# Patient Record
Sex: Male | Born: 1975 | Race: White | Hispanic: No | Marital: Single | Smoking: Current every day smoker
Health system: Southern US, Community
[De-identification: ages and names within clinical notes are randomized; demographics above are authoritative.]

## PROBLEM LIST (undated history)

## (undated) DIAGNOSIS — G2581 Restless legs syndrome: Secondary | ICD-10-CM

## (undated) DIAGNOSIS — M1990 Unspecified osteoarthritis, unspecified site: Secondary | ICD-10-CM

## (undated) DIAGNOSIS — M5430 Sciatica, unspecified side: Secondary | ICD-10-CM

## (undated) HISTORY — PX: TESTICLE SURGERY: SHX794

---

## 1998-08-14 ENCOUNTER — Emergency Department (HOSPITAL_COMMUNITY): Admission: EM | Admit: 1998-08-14 | Discharge: 1998-08-14 | Payer: Self-pay | Admitting: Emergency Medicine

## 2001-11-02 ENCOUNTER — Emergency Department (HOSPITAL_COMMUNITY): Admission: EM | Admit: 2001-11-02 | Discharge: 2001-11-02 | Payer: Self-pay | Admitting: Internal Medicine

## 2002-04-21 ENCOUNTER — Emergency Department (HOSPITAL_COMMUNITY): Admission: EM | Admit: 2002-04-21 | Discharge: 2002-04-21 | Payer: Self-pay | Admitting: Emergency Medicine

## 2003-08-05 ENCOUNTER — Emergency Department (HOSPITAL_COMMUNITY): Admission: EM | Admit: 2003-08-05 | Discharge: 2003-08-06 | Payer: Self-pay | Admitting: *Deleted

## 2004-07-12 ENCOUNTER — Emergency Department (HOSPITAL_COMMUNITY): Admission: EM | Admit: 2004-07-12 | Discharge: 2004-07-12 | Payer: Self-pay | Admitting: Family Medicine

## 2004-12-27 ENCOUNTER — Emergency Department (HOSPITAL_COMMUNITY): Admission: EM | Admit: 2004-12-27 | Discharge: 2004-12-27 | Payer: Self-pay | Admitting: Family Medicine

## 2005-01-03 ENCOUNTER — Emergency Department (HOSPITAL_COMMUNITY): Admission: EM | Admit: 2005-01-03 | Discharge: 2005-01-03 | Payer: Self-pay | Admitting: Emergency Medicine

## 2005-01-09 ENCOUNTER — Emergency Department (HOSPITAL_COMMUNITY): Admission: EM | Admit: 2005-01-09 | Discharge: 2005-01-09 | Payer: Self-pay | Admitting: Emergency Medicine

## 2005-01-15 ENCOUNTER — Emergency Department (HOSPITAL_COMMUNITY): Admission: EM | Admit: 2005-01-15 | Discharge: 2005-01-15 | Payer: Self-pay | Admitting: Emergency Medicine

## 2005-04-20 ENCOUNTER — Emergency Department (HOSPITAL_COMMUNITY): Admission: EM | Admit: 2005-04-20 | Discharge: 2005-04-20 | Payer: Self-pay | Admitting: Family Medicine

## 2005-10-21 ENCOUNTER — Ambulatory Visit: Payer: Self-pay | Admitting: Internal Medicine

## 2005-10-21 ENCOUNTER — Ambulatory Visit: Payer: Self-pay | Admitting: Infectious Diseases

## 2005-10-21 ENCOUNTER — Inpatient Hospital Stay (HOSPITAL_COMMUNITY): Admission: EM | Admit: 2005-10-21 | Discharge: 2005-10-25 | Payer: Self-pay | Admitting: Emergency Medicine

## 2007-12-15 ENCOUNTER — Emergency Department (HOSPITAL_COMMUNITY): Admission: EM | Admit: 2007-12-15 | Discharge: 2007-12-15 | Payer: Self-pay | Admitting: Emergency Medicine

## 2007-12-18 ENCOUNTER — Ambulatory Visit (HOSPITAL_COMMUNITY): Admission: RE | Admit: 2007-12-18 | Discharge: 2007-12-18 | Payer: Self-pay | Admitting: Emergency Medicine

## 2007-12-18 ENCOUNTER — Emergency Department (HOSPITAL_COMMUNITY): Admission: EM | Admit: 2007-12-18 | Discharge: 2007-12-18 | Payer: Self-pay | Admitting: Emergency Medicine

## 2008-04-06 ENCOUNTER — Inpatient Hospital Stay (HOSPITAL_COMMUNITY): Admission: EM | Admit: 2008-04-06 | Discharge: 2008-04-09 | Payer: Self-pay | Admitting: Emergency Medicine

## 2008-04-06 ENCOUNTER — Encounter (INDEPENDENT_AMBULATORY_CARE_PROVIDER_SITE_OTHER): Payer: Self-pay | Admitting: Emergency Medicine

## 2008-11-10 ENCOUNTER — Emergency Department (HOSPITAL_COMMUNITY): Admission: EM | Admit: 2008-11-10 | Discharge: 2008-11-11 | Payer: Self-pay | Admitting: Emergency Medicine

## 2008-11-10 ENCOUNTER — Emergency Department (HOSPITAL_COMMUNITY): Admission: EM | Admit: 2008-11-10 | Discharge: 2008-11-10 | Payer: Self-pay | Admitting: Family Medicine

## 2008-11-29 ENCOUNTER — Emergency Department (HOSPITAL_COMMUNITY): Admission: EM | Admit: 2008-11-29 | Discharge: 2008-11-29 | Payer: Self-pay | Admitting: Family Medicine

## 2008-12-20 ENCOUNTER — Emergency Department (HOSPITAL_COMMUNITY): Admission: EM | Admit: 2008-12-20 | Discharge: 2008-12-20 | Payer: Self-pay | Admitting: Family Medicine

## 2010-05-12 LAB — POCT URINALYSIS DIP (DEVICE)
Nitrite: NEGATIVE
Protein, ur: NEGATIVE mg/dL
Urobilinogen, UA: 1 mg/dL (ref 0.0–1.0)
pH: 7 (ref 5.0–8.0)

## 2010-05-12 LAB — CBC
MCHC: 34.2 g/dL (ref 30.0–36.0)
MCV: 96.2 fL (ref 78.0–100.0)
Platelets: 263 10*3/uL (ref 150–400)

## 2010-05-12 LAB — COMPREHENSIVE METABOLIC PANEL
AST: 13 U/L (ref 0–37)
Albumin: 4 g/dL (ref 3.5–5.2)
BUN: 6 mg/dL (ref 6–23)
Calcium: 9.2 mg/dL (ref 8.4–10.5)
Creatinine, Ser: 0.8 mg/dL (ref 0.4–1.5)
GFR calc Af Amer: >60 mL/min (ref >60)
GFR calc non Af Amer: >60 mL/min (ref >60)

## 2010-05-12 LAB — DIFFERENTIAL
Basophils Relative: 1 % (ref 0–1)
Eosinophils Absolute: 0.2 10*3/uL (ref 0.0–0.7)
Lymphs Abs: 3.1 10*3/uL (ref 0.7–4.0)
Neutro Abs: 7.8 10*3/uL — ABNORMAL HIGH (ref 1.7–7.7)
Neutrophils Relative %: 67 % (ref 43–77)

## 2010-05-12 LAB — URINALYSIS, ROUTINE W REFLEX MICROSCOPIC
Glucose, UA: NEGATIVE mg/dL
Protein, ur: NEGATIVE mg/dL
Specific Gravity, Urine: 1.027 (ref 1.005–1.030)
Urobilinogen, UA: 1 mg/dL (ref 0.0–1.0)

## 2010-05-19 LAB — URINE MICROSCOPIC-ADD ON

## 2010-05-19 LAB — BASIC METABOLIC PANEL
Calcium: 8.7 mg/dL (ref 8.4–10.5)
Chloride: 107 mEq/L (ref 96–112)
Creatinine, Ser: 0.82 mg/dL (ref 0.4–1.5)
GFR calc Af Amer: >60 mL/min (ref >60)
GFR calc Af Amer: >60 mL/min (ref >60)
Potassium: 3.4 mEq/L — ABNORMAL LOW (ref 3.5–5.1)
Sodium: 138 mEq/L (ref 135–145)

## 2010-05-19 LAB — URINALYSIS, ROUTINE W REFLEX MICROSCOPIC
Glucose, UA: NEGATIVE mg/dL
Leukocytes, UA: NEGATIVE
Protein, ur: 30 mg/dL — AB
pH: 8 (ref 5.0–8.0)

## 2010-05-19 LAB — LUPUS ANTICOAGULANT PANEL
DRVVT: 39.5 secs (ref 36.1–47.0)
Lupus Anticoagulant: NOT DETECTED

## 2010-05-19 LAB — CBC
HCT: 38.3 % — ABNORMAL LOW (ref 39.0–52.0)
Hemoglobin: 13.2 g/dL (ref 13.0–17.0)
MCV: 95.1 fL (ref 78.0–100.0)
RBC: 4.03 MIL/uL — ABNORMAL LOW (ref 4.22–5.81)
RBC: 4.5 MIL/uL (ref 4.22–5.81)
WBC: 14.9 10*3/uL — ABNORMAL HIGH (ref 4.0–10.5)
WBC: 8.9 10*3/uL (ref 4.0–10.5)

## 2010-05-19 LAB — DIFFERENTIAL
Basophils Relative: 0 % (ref 0–1)
Lymphocytes Relative: 16 % (ref 12–46)
Lymphs Abs: 2.4 10*3/uL (ref 0.7–4.0)
Monocytes Relative: 6 % (ref 3–12)
Neutro Abs: 11.6 10*3/uL — ABNORMAL HIGH (ref 1.7–7.7)
Neutrophils Relative %: 78 % — ABNORMAL HIGH (ref 43–77)

## 2010-05-19 LAB — SYNOVIAL CELL COUNT + DIFF, W/ CRYSTALS
Neutrophil, Synovial: 98 % — ABNORMAL HIGH (ref 0–25)
WBC, Synovial: 45000 /mm3 — ABNORMAL HIGH (ref 0–200)

## 2010-05-19 LAB — BODY FLUID CULTURE: Culture: NO GROWTH

## 2010-05-19 LAB — ANA: Anti Nuclear Antibody(ANA): NEGATIVE

## 2010-05-19 LAB — HSV PCR: HSV 2 , PCR: NOT DETECTED

## 2010-05-19 LAB — CULTURE, BLOOD (SINGLE)

## 2010-06-21 NOTE — H&P (Signed)
Brandon Bartlett, Brandon Bartlett        ACCOUNT NO.:  [id number]   MEDICAL RECORD NO.:  [id number]          PATIENT TYPE:  INP   LOCATION:                               FACILITY:  [facility]   PHYSICIAN:  Alvy Beal, MD    DATE OF BIRTH:  04/16/1975   DATE OF ADMISSION:  04/06/2008  DATE OF DISCHARGE:                              HISTORY & PHYSICAL   REASON FOR ADMISSION:  Right septic hip.   HISTORY:  This is a very pleasant 35 year old gentleman who first began  complaining of significant right groin pain on Sunday evening, February  29, 2010.  He presented to the emergency room early this afternoon at  approximately 2:00 p.m.  During the course of his workup, a CT scan was  done and there was a question of a fluid collection in the right hip.  While he was being evaluated, he was noted to be febrile to about 101.4  and his white count was elevated.  Because of the fluid collection and  elevated white count, there was a question of a septic hip.  At  approximately 8:46 p.m., I was called to see the patient.  I arrived to  first evaluated the patient at approximately 9:15.   At the time of initial evaluation,  he was complaining of severe right  groin pain.  The hip was in a flexed, slightly abducted position.  Because of the these fluid symptoms and his fever and elevated white  count, the patient was planned for admission for further workup and  treatment.   The patient's past medical, surgical, family, and social histories are  essentially unremarkable.  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  He denies IV drug abuse, but he does use  marijuana and he smokes cigarettes.   PHYSICAL EXAMINATION:  GENERAL:  He is a pleasant gentleman who appears  his stated age, in no acute distress.  He is alert and oriented x3.  NEUROLOGIC:  Cranial nerves II-XII were tested.  They are intact.  CHEST:  No shortness of breath.  CARDIAC:  No chest pain.  ABDOMEN:   Soft and nontender.  EXTREMITIES:  He has full range of motion of the left lower extremity  and bilateral upper extremities.  No crepitus, deformity, or pain.  No  significant swelling.  Right lower extremity, he holds the hip in a flexed abducted position.  His distal neurovascular exam is within normal limits.  Intact  peripheral pulses.  Significant pain with any attempted range of motion.   CT scan was reviewed.  I agree with the findings of the radiologist that  there is a fluid within the right anterior hip capsule.  There is no  fracture.   At this point in time, I spoke with my partner, Dr. Charlann Boxer, our hip  surgeon specialist, concerning an appropriate treatment/plan.  Per his  recommendation, I have contacted the interventional radiologist, and  under fluoroscopic guidance, the hip was aspirated.  The aspirate did  appear to be somewhat purulent but was not frank gross brown pus.  After  the aspirate was  obtained, the patient will be started on appropriate  broad spectrum IV antibiotics.  I will admit him for further management.  I have contacted the hospitalist for further medical management.  Dr.  Charlann Boxer will be seeing the patient in the morning to discuss the possible  need for an open surgical I and D.  During the aspiration,  we were able to irrigate the hip.  Post aspiration, the patient's pain  was significantly better.  I was able to extend the hip and gently  ranged it with less pain.   I did explain this to the patient, his fiance, and to his mother.      Alvy Beal, MD  Electronically Signed     DDB/MEDQ  D:  04/06/2008  T:  04/07/2008  Job:  [id number]

## 2010-06-21 NOTE — Consult Note (Signed)
NAMEPHENG, PROKOP        ACCOUNT NO.:  [id number]   MEDICAL RECORD NO.:  [id number]          PATIENT TYPE:  INP   LOCATION:  5125                         FACILITY:  [facility]   PHYSICIAN:  Carlena Hurl, MDDATE OF BIRTH:  [birth year]   DATE OF CONSULTATION:  DATE OF DISCHARGE:                                 CONSULTATION   CHIEF COMPLAINT:  This patient has right hip and groin pain.   HISTORY OF PRESENT ILLNESS:  This is a 35 year old Caucasian gentleman  who has no significant past medical history, who came in to the ER with  3 weeks history of right hip pain, and the patient was seen by Dr.  Shon Baton.  The patient underwent a CT scan, which showed fluid in the  right hip.  The patient was diagnosed to have right septic arthritis and  he underwent right hip aspiration, specimens were sent to laboratory for  testing, and we were consulted for the antibiotic management of septic  arthritis.   PAST MEDICAL HISTORY:  Past medical history of this patient is  unremarkable except for some allergic reaction.   PAST SURGICAL HISTORY:  The patient had undescended testes for which he  underwent a surgery as a child.   SOCIAL HISTORY:  The patient lives with his fiance, and he smokes about  2 packs per day for the past 10 years.  Denies alcohol, denies IV drug  abuse, and says he smokes marijuana occasionally.   FAMILY HISTORY:  Nothing significant except for degenerative disk  disease in his mother.   ALLERGIES:  He is allergic to HYDROCODONE and PREDNISONE.   PHYSICAL EXAMINATION:  GENERAL:  This is a 35 year old Caucasian  gentleman who is lying on the bed with severe right hip and groin pain,  otherwise no significant shortness of breath.  No significant chest  pain.  HEENT:  Head is atraumatic and normocephalic.  PERRLA.  Tympanic  membranes intact.  No discharge from eyes or ears.  LUNGS:  Clear to auscultation bilaterally.  CARDIOVASCULAR:  S1, S2 heard with  regular rate and rhythm.  ABDOMEN:  Soft.  Bowel sounds present.  Nontender and distended.  EXTREMITIES:  No pedal edema noted.  Pulses palpable bilaterally,  significant restriction and tenderness of the right hip.  The patient is  status post right hip aspiration.   LABORATORY DATA:  Hemoglobin 15.1, WBC 14.9, hematocrit 43.0,  neutrophils up 78%, absolute neutrophil count of 11.6.  ESR is 9.  Sodium 135, potassium 3.8, chloride 103, bicarbonate 25, glucose 102,  BUN 8, and creatinine 0.82.  A synovial fluid analysis was done, which  showed WBC of 45,000, neutrophil/eosinophil is 48/98, and no crystals  were seen.  Culture of the right hip aspiration is pending at this time.  Urinalysis was done, which is very insignificant except for the protein  of 30.  The patient had lumbar spine x-ray, which showed no acute  osseous lesion.  He had a CT abdomen done, which showed negative  abdominal CT.  No evidence of appendicitis.  CT pelvis showed no acute  pelvic findings.   CLINICAL ASSESSMENT AND PLAN:  A 35 year old  gentleman with no  significant past medical history, coming in with 3 weeks history of  right groin and hip pain, now right hip aspiration was done, which  showed significant pus and there is leukocytosis and significant WBC's  on synovial fluid.  1. Septic arthritis.  The patient denies having any IV drug abuse      history in the past.  So, further etiology for septic arthritis is      unknown.  We are going to cover him empirically with broad-spectrum      antibiotics with IV vancomycin as well as IV Zosyn with Pharmacy to      dose it.  Once we have the culture and sensitivity of the right hip      aspiration, we will change the antibiotics according to that.  2. Deep venous thrombosis prophylaxis.  The patient will be placed on      Lovenox 40 mg subcutaneously one time a day.      Carlena Hurl, MD  Electronically Signed     JD/MEDQ  D:  04/07/2008  T:   04/07/2008  Job:  [id number]

## 2010-06-24 NOTE — Discharge Summary (Signed)
NAMEDEVION, Brandon Bartlett        ACCOUNT NO.:  [id number]   MEDICAL RECORD NO.:  [id number]          PATIENT TYPE:  INP   LOCATION:  5033                         FACILITY:  [facility]   PHYSICIAN:  Madlyn Frankel. Charlann Boxer, M.D.  DATE OF BIRTH:  [birth year]   DATE OF ADMISSION:  04/06/2008  DATE OF DISCHARGE:  04/09/2008                               DISCHARGE SUMMARY   PREOPERATIVE DIAGNOSIS:  Right hip pain with an unclear etiology, at  this point a nonseptic inflammatory arthropathy with increasing pain of  his right hip.   DISCHARGE DIAGNOSIS:  Nonseptic right hip osteoarthritis.   ADMITTING HISTORY:  The patient is a 35 year old male initially admitted  to Alvy Beal, MD service for evaluation of right hip pain.  He was  seen and evaluated due to this increasing pain over the past 2-3 weeks  with some increased worsening over the weekend.  He was seen and  evaluated on March 1 and admitted.  At the time of his evaluation he was  noted to have a fever with an elevated white blood cell count.  Dr.  Shon Baton had contacted me about the plan and I had suggested a right hip  aspiration and told him I would then follow up with the patient.   PAST MEDICAL HISTORY:  Negative.   PAST SURGICAL HISTORY:  Negative.   FAMILY HISTORY:  Noncontributory.   SOCIAL HISTORY:  He does report smoking history.  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  He currently does not have insurance and was worried  some about this.   Please note that he did have an undescended testicle released when he  was a child.  He also reports smoking marijuana at times.   HOSPITAL COURSE:  The patient was admitted to the hospital on April 06, 2008.  At the time of initial evaluation we had recommended or suggested  that he have a hip aspiration done.  A right hip aspiration was done.  Cell count of the aspiration demonstrated 45,000 white cells.  This was  within an inflammatory range as opposed to true septic range and thus  chose to observe him.  The cultures were negative at 3 days.  His  symptoms slowly subsided with the use of anti-inflammatory medication  including Toradol and oral anti-inflammatory agents.   I had asked him to stay over the weekend for culture evaluation to make  sure the culture did not turn positive.  There were no significant  events.  I did order inflammatory markers.  He had no evidence of lupus  anticoagulant.  He had a sed rate that was in the normal range of 9.  He  had a CRP that was elevated at 5 with a normal high of less than 0.6.   We did place him on prophylactic Vancomycin that was held once the  cultures came back negative at 3 days.   By hospital day #4 on April 09, 2008, he was prepared for discharge with  an understanding to follow up with progressive or worsening pain.  He  was placed on oral anti-inflammatories at discharge.   DISCHARGE MEDICATIONS:  1. Keflex 500 mg every 6 hours, take as directed, prescription given.  2. Darvocet take 1-2 tablets every 6 hours as needed for pain.  3. Relafen 500 mg p.o. b.i.d. to take as needed for pain.   PLAN:  Follow up with me within a week.  Phone number provided.   DISCHARGE INSTRUCTIONS:  Crutches given for an as-needed weightbearing  status.      Madlyn Frankel Charlann Boxer, M.D.  Electronically Signed     MDO/MEDQ  D:  05/28/2008  T:  05/28/2008  Job:  [id number]

## 2010-06-24 NOTE — Discharge Summary (Signed)
Brandon Bartlett, Brandon Bartlett        ACCOUNT NO.:  [id number]   MEDICAL RECORD NO.:  [id number]          PATIENT TYPE:  INP   LOCATION:  6705                         FACILITY:  [facility]   PHYSICIAN:  Ileana Roup, M.D.  DATE OF BIRTH:  1976/01/16   DATE OF ADMISSION:  10/21/2005  DATE OF DISCHARGE:  10/25/2005                               DISCHARGE SUMMARY   DISCHARGE DIAGNOSES:  1. Bilateral pneumonia secondary to Streptococcus pneumoniae.  2. Acute hypoxic respiratory failure due to #1, managed with BIPAP  3. Sinusitis.  4. Chronic back pain.   DISCHARGE MEDICATIONS:  1. Levaquin 750 mg p.o. daily to complete 14 days of treatment.  2. Guaifenesin 600 mg p.o. p.r.n.  3. Albuterol MDI 2 inhalations q.6h. p.r.n.  4. Nicotine patch 21 mg 1 patch daily.   DISCHARGE DISPOSITION:  [facility] Tromp [facility].  Since he was discharged on the weekend, the  clinic was made aware that he will need an appointment, and he will be  called on Monday with the date of his appointment.   </CONSULTANTS>/  Dr. Johny Sax, infectious disease   </PROCEDURES>/  1. CXR October 21, 2005 showed bibasilar patchy alveolar opacities  2. CT of head September 15,2007 showed ethmoid and sphenoid sinusitis,      but no acute intracranial finding.  3. Fluoroscopic guided lumbar puncture on October 21, 2005 showed      opening pressure of 120.   HISTORY AND PHYSICAL:  Mr. Brickell is a 35 year old white man with a  past medical history only of chronic back pain who came in the emergency  room complaining of one-week history of cough, shortness of breath,  sinus congestion, fever, nausea and vomiting.  He was complaining of a  progressive headache as well as myalgias.  In the two days prior to  admission, he was also complaining of some back and neck pain as well as  his wife was stating that he was more confused.  He had streaks of blood  in his  sputum on a few occasions.  He also reported diffuse abdominal  pain and multiple tick bites over the past few weeks.  He denies a  history of HIV, history of IV drug use, and also denied any rash.   PHYSICAL EXAMINATION:  VITAL SIGNS:  On physical exam, he had a  temperature of 102.0, heart rate 143, blood pressure of 119/73,  respiratory rate 32.  He was saturating at 89% on 15 liters, later on  82% on a non-rebreather face mask at 100% FIO2 and then was put on BiPAP  and was saturating at 95% on this.  GENERAL:  He was oriented x3, was arousable but somewhat somnolent.  NECK:  He had some neck stiffness with severe neck pain.  NEUROLOGICAL:  Neuro exam was nonfocal.  Cranial nerves were all normal.  HEART:  Tachycardiac without murmurs, rubs or gallops.  LUNGS:  He had diffuse rhonchi on lung exam.  ABDOMEN:  His abdomen was diffusely tender without guarding.  Bowel  sounds were normal.  SKIN:  His skin was warm  without any rash.   LABORATORY DATA:  WBC 11.4, ANC 9.8, hemoglobin 16.9, platelets 284.  A  BMP was completely normal.  Liver panel was also normal.  His urine drug  screen was positive for THC and benzodiazepines.  Alcohol level was less  than 5, and a PTT was 33.  ABG showed a pH of 7.29, pCO2 48.5 and pO2  115 on the BiPAP.  Blood cultures were ordered as were sputum Gram stain  and culture as well as HIV antibody and RMSF serology.   HOSPITAL COURSE:  1. Pneumonia.  The admitting history and physical was very worrisome      for acute bacterial meningitis; a lumbar puncture was attempted in      the emergency room unsuccessfully. Interventional radiology was      contacted to perform this.  He was started empirically on      vancomycin, Rocephin and dexamethasone for possible acute bacterial      meningitis, and he was also started on IV doxycycline because of      the history of multiple tick bites.  Infectious disease      consultation was obtained.  A urine antigen  test was positive for      Streptococcus pneumoniae.  Lumbar puncture results showed no      organisms seen on the Gram stain, protein normal at 42, glucose      normal at 83; the fluid was colorless, and there was 4 WBCs, 70      RBCs and an insignificant amount of segmented neutrophils.  HIV      antibody was negative; [facility] Spotted Fever serology (IgG      and IgM antibody) was negative; HSV PCR on the CSF was negative;      Legionella urinary antigen was negative.  The chest x-ray      originally performed on the patient in the emergency room had      showed bilateral basilar patchy opacities that may represent      infection versus edema or hemorrhage.  By the second day of      hospitalization, the patient was already feeling much better with a      decrease in his white count.  The patient was afebrile, and he was      already put on nasal cannula only for oxygen supplementation.  His      antibiotics were later consolidated to Rocephin and doxycycline      only, and the patient was felt to be safe for discharge on      September 18 on p.o. Levaquin only.  On the day of his discharge,      his saturation was 96% on room air, decreasing to 90-93% with      ambulation.  He still had scattered rhonchi in his lungs but much      improved air movement, and his WBC was decreased to 11.0.  He was      given an appointment in the outpatient clinic to follow up.  2. Acute hypoxic respiratory failure due to #1.  Patient was hypoxic      in the ED with oxygen saturation as low as 82%.  This was managed      initially with BIPAP, and then with supplemental oxygen as his      status improved.  His oxygen saturation was 96% at rest on room air      by the day of discharge.  3.  Sinusitis.  Antibiotic coverage given as outlined above.   DISCHARGE LABORATORY DATA AND VITAL SIGNS:  Temperature 98.0, heart rate 54, blood pressure 120/77, saturation 96% on room air, 93% on  ambulation.  WBC  11.0, hemoglobin 12.7, platelets 263.  Sodium 144,  potassium 3.8, chloride 109, CO2 27, BUN 7, creatinine 0.9, glucose 99.      Dellia Beckwith, M.D.  Electronically Signed      Ileana Roup, M.D.  Electronically Signed   VD/MEDQ  D:  10/28/2005  T:  10/31/2005  Job:  [id number]   cc:   Lacretia Leigh. Ninetta Lights, M.D.

## 2010-09-07 ENCOUNTER — Emergency Department (HOSPITAL_COMMUNITY)
Admission: EM | Admit: 2010-09-07 | Discharge: 2010-09-07 | Disposition: A | Discharge status: Home / Self Care | Payer: Self-pay | Attending: Emergency Medicine | Admitting: Emergency Medicine

## 2010-09-07 ENCOUNTER — Emergency Department (HOSPITAL_COMMUNITY): Payer: Self-pay

## 2010-09-07 DIAGNOSIS — M25529 Pain in unspecified elbow: Secondary | ICD-10-CM | POA: Insufficient documentation

## 2010-09-07 DIAGNOSIS — S52123A Displaced fracture of head of unspecified radius, initial encounter for closed fracture: Secondary | ICD-10-CM | POA: Insufficient documentation

## 2010-09-07 DIAGNOSIS — R942 Abnormal results of pulmonary function studies: Secondary | ICD-10-CM | POA: Insufficient documentation

## 2010-09-21 ENCOUNTER — Emergency Department (HOSPITAL_COMMUNITY)
Admission: EM | Admit: 2010-09-21 | Discharge: 2010-09-21 | Disposition: A | Discharge status: Home / Self Care | Payer: Self-pay | Attending: Emergency Medicine | Admitting: Emergency Medicine

## 2010-09-21 DIAGNOSIS — M25529 Pain in unspecified elbow: Secondary | ICD-10-CM | POA: Insufficient documentation

## 2010-11-08 LAB — URINALYSIS, ROUTINE W REFLEX MICROSCOPIC
Bilirubin Urine: NEGATIVE
Glucose, UA: NEGATIVE
Hgb urine dipstick: NEGATIVE
Ketones, ur: 40 — AB
Protein, ur: NEGATIVE
pH: 6

## 2010-11-08 LAB — CBC
MCHC: 33.7
MCV: 95.9
RDW: 13.5

## 2010-11-08 LAB — DIFFERENTIAL
Eosinophils Absolute: 0
Eosinophils Relative: 0
Lymphocytes Relative: 12
Lymphs Abs: 1.9
Monocytes Absolute: 0.4
Monocytes Relative: 3

## 2010-11-08 LAB — COMPREHENSIVE METABOLIC PANEL
ALT: 28
Calcium: 10.1
Creatinine, Ser: 0.82
GFR calc Af Amer: >60
Glucose, Bld: 100 — ABNORMAL HIGH
Sodium: 139
Total Protein: 7.2

## 2010-12-19 ENCOUNTER — Encounter: Payer: Self-pay | Admitting: *Deleted

## 2010-12-19 ENCOUNTER — Emergency Department (HOSPITAL_COMMUNITY)
Admission: EM | Admit: 2010-12-19 | Discharge: 2010-12-19 | Disposition: A | Discharge status: Home / Self Care | Payer: Self-pay | Attending: Emergency Medicine | Admitting: Emergency Medicine

## 2010-12-19 DIAGNOSIS — M549 Dorsalgia, unspecified: Secondary | ICD-10-CM | POA: Insufficient documentation

## 2010-12-19 DIAGNOSIS — F17200 Nicotine dependence, unspecified, uncomplicated: Secondary | ICD-10-CM | POA: Insufficient documentation

## 2010-12-19 DIAGNOSIS — M5432 Sciatica, left side: Secondary | ICD-10-CM

## 2010-12-19 DIAGNOSIS — M5430 Sciatica, unspecified side: Secondary | ICD-10-CM | POA: Insufficient documentation

## 2010-12-19 DIAGNOSIS — R209 Unspecified disturbances of skin sensation: Secondary | ICD-10-CM | POA: Insufficient documentation

## 2010-12-19 HISTORY — DX: Restless legs syndrome: G25.81

## 2010-12-19 HISTORY — DX: Unspecified osteoarthritis, unspecified site: M19.90

## 2010-12-19 MED ORDER — HYDROCODONE-ACETAMINOPHEN 5-325 MG PO TABS: 1.0000 | ORAL_TABLET | Freq: Four times a day (QID) | ORAL | Status: AC | PRN

## 2010-12-19 MED ORDER — PREDNISONE 50 MG PO TABS: 50.0000 mg | ORAL_TABLET | Freq: Every day | ORAL | Status: AC

## 2010-12-19 MED ORDER — CYCLOBENZAPRINE HCL 10 MG PO TABS: 10.0000 mg | ORAL_TABLET | Freq: Three times a day (TID) | ORAL | Status: DC | PRN

## 2010-12-19 MED ORDER — CYCLOBENZAPRINE HCL 10 MG PO TABS: 10.0000 mg | ORAL_TABLET | Freq: Three times a day (TID) | ORAL | Status: AC | PRN

## 2010-12-19 NOTE — ED Provider Notes (Signed)
Medical screening examination/treatment/procedure(s) were performed by non-physician practitioner and as supervising physician I was immediately available for consultation/collaboration.    Adlee Paar R Timm Bonenberger, MD 12/19/10 2359 

## 2010-12-19 NOTE — ED Notes (Signed)
Pt reports eye pain/pressure that has moved to his back below his shoulder blade and down L side of back.

## 2010-12-19 NOTE — ED Provider Notes (Signed)
History     CSN: [id number] Arrival date & time: 12/19/2010  4:21 PM   First MD Initiated Contact with Patient 12/19/10 1641      Chief Complaint  Patient presents with  . Back Pain    (Consider location/radiation/quality/duration/timing/severity/associated sxs/prior treatment) Patient is a 35 y.o. male presenting with back pain.  Back Pain  Associated symptoms include numbness. Pertinent negatives include no chest pain, no fever, no abdominal pain, no dysuria and no weakness.   states that he has had several days.  History of back pain that radiates up the left side of his back.  He states that he has chronic back problems.  Patient states it does radiate down into his left lower.  There is no numbness, weakness, bowel or bladder incontinence, no numbness in his anal area or scrotal area.  Patient states that he is able to ambulate without difficult, Other than pain.  Patient denies any trauma to his back.  Past Medical History  Diagnosis Date  . Arthritis   . Restless leg syndrome     History reviewed. No pertinent past surgical history.  No family history on file.  History  Substance Use Topics  . Smoking status: Current Everyday Smoker  . Smokeless tobacco: Not on file  . Alcohol Use: No      Review of Systems  Constitutional: Negative for fever and fatigue.  HENT: Negative for neck pain and neck stiffness.   Respiratory: Negative for shortness of breath.   Cardiovascular: Negative for chest pain.  Gastrointestinal: Negative for nausea, vomiting and abdominal pain.  Genitourinary: Negative for dysuria, frequency and flank pain.  Musculoskeletal: Positive for back pain. Negative for myalgias.  Skin: Negative for rash.  Neurological: Positive for numbness. Negative for weakness.    Allergies  Codeine and Penicillins  Home Medications   Current Outpatient Rx  Name Route Sig Dispense Refill  . ONE-A-DAY MENS HEALTH FORMULA PO TABS Oral Take 1 tablet by mouth  daily.      Marland Kitchen NAPROXEN SODIUM 220 MG PO TABS Oral Take 440 mg by mouth as needed. pain       BP 121/82  Pulse 79  Temp(Src) 97.6 F (36.4 C) (Oral)  Resp 16  SpO2 100%  Physical Exam  Constitutional: He is oriented to person, place, and time. He appears well-developed and well-nourished. No distress.  HENT:  Head: Normocephalic and atraumatic.  Right Ear: External ear normal.  Left Ear: External ear normal.  Nose: Nose normal.  Mouth/Throat: Oropharynx is clear and moist.  Eyes: Pupils are equal, round, and reactive to light.  Cardiovascular: Normal rate, regular rhythm and normal heart sounds.   Pulmonary/Chest: Effort normal and breath sounds normal.  Musculoskeletal:       Lumbar back: He exhibits tenderness and pain. He exhibits no bony tenderness.       Back:  Neurological: He is alert and oriented to person, place, and time. He has normal strength. No sensory deficit. Coordination normal.  Reflex Scores:      Patellar reflexes are 2+ on the right side and 2+ on the left side.      Achilles reflexes are 2+ on the right side and 2+ on the left side. Skin: Skin is warm and dry. No rash noted.    ED Course  Procedures (including critical care time)  Labs Reviewed - No data to display No results found.   No diagnosis found.    MDM  Patient history, having had an episode  chronic back pain sciatica.  This is based on his history of present illness and physical exam findings.  The patient has normal reflexes and normal gait.  Patient has no motor deficits noted on exam of the patient.  Patient is advised to return here for any worsening in his condition.  Told to use ice and heat on his lower back.       Carlyle Dolly, [place] 12/19/10 1714

## 2011-08-17 ENCOUNTER — Emergency Department (HOSPITAL_COMMUNITY): Payer: Self-pay

## 2011-08-17 ENCOUNTER — Encounter (HOSPITAL_COMMUNITY): Payer: Self-pay | Admitting: Emergency Medicine

## 2011-08-17 ENCOUNTER — Emergency Department (HOSPITAL_COMMUNITY)
Admission: EM | Admit: 2011-08-17 | Discharge: 2011-08-17 | Disposition: A | Discharge status: Home / Self Care | Payer: Self-pay | Attending: Emergency Medicine | Admitting: Emergency Medicine

## 2011-08-17 DIAGNOSIS — Z8739 Personal history of other diseases of the musculoskeletal system and connective tissue: Secondary | ICD-10-CM | POA: Insufficient documentation

## 2011-08-17 DIAGNOSIS — F17200 Nicotine dependence, unspecified, uncomplicated: Secondary | ICD-10-CM | POA: Insufficient documentation

## 2011-08-17 DIAGNOSIS — M25579 Pain in unspecified ankle and joints of unspecified foot: Secondary | ICD-10-CM | POA: Insufficient documentation

## 2011-08-17 DIAGNOSIS — M25572 Pain in left ankle and joints of left foot: Secondary | ICD-10-CM

## 2011-08-17 HISTORY — DX: Sciatica, unspecified side: M54.30

## 2011-08-17 MED ORDER — NAPROXEN 500 MG PO TABS: 500.0000 mg | ORAL_TABLET | Freq: Two times a day (BID) | ORAL | Status: DC

## 2011-08-17 NOTE — ED Provider Notes (Signed)
History     CSN: [id number]  Arrival date & time 08/17/11  1253   First MD Initiated Contact with Patient 08/17/11 1258      Chief Complaint  Patient presents with  . Ankle Pain    1 month hx of ankle pain, increased this am    (Consider location/radiation/quality/duration/timing/severity/associated sxs/prior treatment) Patient is a 36 y.o. male presenting with ankle pain. The history is provided by the patient.  Ankle Pain  The incident occurred yesterday. There was no injury mechanism. The pain is present in the left ankle. The quality of the pain is described as aching. The pain is at a severity of 7/10. The pain is moderate. Associated symptoms include loss of motion. Pertinent negatives include no numbness, no muscle weakness, no loss of sensation and no tingling.  Pt states he did injure his left ankle after falling about a month ago. States pain at that time, but no significant swelling or bruising. It got better. States sore last night and this morning unable to walk on it. No other injuries. No unusual physical activities. No swelling, redness, no fever. No other complaints. Took vico profen and ibuprofen with no relief.   Past Medical History  Diagnosis Date  . Arthritis   . Restless leg syndrome   . Sciatic pain     Past Surgical History  Procedure Date  . Testicle surgery     Family History  Problem Relation Age of Onset  . Cancer Mother     History  Substance Use Topics  . Smoking status: Current Everyday Smoker    Types: Cigarettes  . Smokeless tobacco: Not on file  . Alcohol Use: Yes      Review of Systems  Constitutional: Negative for fever and chills.  Respiratory: Negative.   Cardiovascular: Negative.   Gastrointestinal: Negative.   Musculoskeletal: Positive for joint swelling and arthralgias.  Skin: Negative for color change, rash and wound.  Neurological: Negative for tingling, weakness and numbness.    Allergies  Codeine and  Penicillins  Home Medications   Current Outpatient Rx  Name Route Sig Dispense Refill  . HYDROCODONE-IBUPROFEN 7.5-200 MG PO TABS Oral Take 1 tablet by mouth every 8 (eight) hours as needed. Pain    . ONE-A-DAY MENS HEALTH FORMULA PO TABS Oral Take 1 tablet by mouth daily.        BP 146/89  Pulse 90  Temp 98.6 F (37 C)  Resp 18  SpO2 100%  Physical Exam  Nursing note and vitals reviewed. Constitutional: He is oriented to person, place, and time. He appears well-developed and well-nourished.  HENT:  Head: Normocephalic.  Eyes: Conjunctivae are normal.  Neck: Neck supple.  Cardiovascular: Normal rate, regular rhythm and normal heart sounds.   Pulmonary/Chest: Effort normal and breath sounds normal. No respiratory distress. He has no wheezes. He has no rales.  Musculoskeletal:       Left ankle normal appearing. No swelling, redness, bruising. Pain with palpation over medial and lateral malleoli. Pain with dorsiflexion, plantarflexion, inversion, eversion. Joint stable with negative anterior and posterior drawer signs. Achillis tendon tender, but intact. Normal foot. Dorsal pedal pulses normal  Neurological: He is alert and oriented to person, place, and time.  Skin: Skin is warm and dry.    ED Course  Procedures (including critical care time)  No recent injury, acute onset of ankle pain. Ankle does not appaer infected. Good dorsal pedal pulse, normal toes, good cap refill. No swelling noted. Will get x-ray.  Dg Ankle Complete Left  08/17/2011  *RADIOLOGY REPORT*  Clinical Data: Ankle pain  LEFT ANKLE COMPLETE - 3+ VIEW  Comparison: None  Findings: There is no evidence of fracture or dislocation. Posterior calcaneal heel spur noted.  There is no evidence of arthropathy or other focal bone abnormality.  Soft tissues are unremarkable.  IMPRESSION:  No acute findings.  Original Report Authenticated By: Rosealee Albee, M.D.   Pt unable to ambulate on the ankle. Joint appears  intact, achillis intact. I do not see any signs of infection, no signs of vascular compromise, no bruising, no swelling, skin is normal, no signs of a dvt. Will give crutches, aso, follow up with orthopedics.      1. Pain in left ankle       MDM          Lottie Mussel, PA 08/17/11 1447

## 2011-08-17 NOTE — ED Provider Notes (Signed)
Medical screening examination/treatment/procedure(s) were performed by non-physician practitioner and as supervising physician I was immediately available for consultation/collaboration.  Ethelda Chick, MD 08/17/11 [phone]

## 2011-08-17 NOTE — ED Notes (Signed)
Pt reports minor foot injury 1 month ago, sharp increase in pain last 24 hrs

## 2012-04-13 ENCOUNTER — Emergency Department (HOSPITAL_COMMUNITY): Payer: Self-pay

## 2012-04-13 ENCOUNTER — Encounter (HOSPITAL_COMMUNITY): Payer: Self-pay | Admitting: Emergency Medicine

## 2012-04-13 ENCOUNTER — Emergency Department (HOSPITAL_COMMUNITY)
Admission: EM | Admit: 2012-04-13 | Discharge: 2012-04-13 | Disposition: A | Discharge status: Home / Self Care | Payer: Self-pay | Attending: Emergency Medicine | Admitting: Emergency Medicine

## 2012-04-13 DIAGNOSIS — L0390 Cellulitis, unspecified: Secondary | ICD-10-CM

## 2012-04-13 DIAGNOSIS — F17200 Nicotine dependence, unspecified, uncomplicated: Secondary | ICD-10-CM | POA: Insufficient documentation

## 2012-04-13 DIAGNOSIS — K0889 Other specified disorders of teeth and supporting structures: Secondary | ICD-10-CM

## 2012-04-13 DIAGNOSIS — L0201 Cutaneous abscess of face: Secondary | ICD-10-CM | POA: Insufficient documentation

## 2012-04-13 DIAGNOSIS — Z8669 Personal history of other diseases of the nervous system and sense organs: Secondary | ICD-10-CM | POA: Insufficient documentation

## 2012-04-13 DIAGNOSIS — R220 Localized swelling, mass and lump, head: Secondary | ICD-10-CM

## 2012-04-13 DIAGNOSIS — L03211 Cellulitis of face: Secondary | ICD-10-CM | POA: Insufficient documentation

## 2012-04-13 DIAGNOSIS — Z8739 Personal history of other diseases of the musculoskeletal system and connective tissue: Secondary | ICD-10-CM | POA: Insufficient documentation

## 2012-04-13 DIAGNOSIS — K089 Disorder of teeth and supporting structures, unspecified: Secondary | ICD-10-CM | POA: Insufficient documentation

## 2012-04-13 LAB — CBC WITH DIFFERENTIAL/PLATELET
Basophils Relative: 1 % (ref 0–1)
Eosinophils Relative: 2 % (ref 0–5)
HCT: 45.8 % (ref 39.0–52.0)
Hemoglobin: 16.6 g/dL (ref 13.0–17.0)
MCHC: 36.2 g/dL — ABNORMAL HIGH (ref 30.0–36.0)
MCV: 87.9 fL (ref 78.0–100.0)
Monocytes Absolute: 0.6 10*3/uL (ref 0.1–1.0)
Monocytes Relative: 5 % (ref 3–12)
Neutro Abs: 9.2 10*3/uL — ABNORMAL HIGH (ref 1.7–7.7)

## 2012-04-13 LAB — BASIC METABOLIC PANEL
BUN: 16 mg/dL (ref 6–23)
CO2: 25 mEq/L (ref 19–32)
Calcium: 8.9 mg/dL (ref 8.4–10.5)
Chloride: 97 mEq/L (ref 96–112)
Creatinine, Ser: 0.82 mg/dL (ref 0.50–1.35)
GFR calc Af Amer: >90 mL/min (ref >90)

## 2012-04-13 MED ORDER — MORPHINE SULFATE 4 MG/ML IJ SOLN
4.0000 mg | Freq: Once | INTRAMUSCULAR | Status: AC
  Administered 2012-04-13: 4 mg via INTRAVENOUS
  Filled 2012-04-13: qty 1

## 2012-04-13 MED ORDER — MORPHINE SULFATE 4 MG/ML IJ SOLN
4.0000 mg | INTRAMUSCULAR | Status: DC | PRN
  Administered 2012-04-13: 4 mg via INTRAVENOUS
  Filled 2012-04-13: qty 1

## 2012-04-13 MED ORDER — CLINDAMYCIN PHOSPHATE 300 MG/50ML IV SOLN
300.0000 mg | Freq: Four times a day (QID) | INTRAVENOUS | Status: DC
  Administered 2012-04-13: 300 mg via INTRAVENOUS
  Filled 2012-04-13: qty 50

## 2012-04-13 MED ORDER — CLINDAMYCIN HCL 300 MG PO CAPS: 300.0000 mg | ORAL_CAPSULE | Freq: Four times a day (QID) | ORAL | Status: DC

## 2012-04-13 MED ORDER — PERCOCET 5-325 MG PO TABS: 1.0000 | ORAL_TABLET | Freq: Four times a day (QID) | ORAL | Status: DC | PRN

## 2012-04-13 MED ORDER — IOHEXOL 300 MG/ML  SOLN
100.0000 mL | Freq: Once | INTRAMUSCULAR | Status: AC | PRN
  Administered 2012-04-13: 100 mL via INTRAVENOUS

## 2012-04-13 MED ORDER — ONDANSETRON HCL 4 MG/2ML IJ SOLN
4.0000 mg | Freq: Once | INTRAMUSCULAR | Status: AC
  Administered 2012-04-13: 4 mg via INTRAVENOUS
  Filled 2012-04-13: qty 2

## 2012-04-13 MED ORDER — SODIUM CHLORIDE 0.9 % IV SOLN
Freq: Once | INTRAVENOUS | Status: AC
  Administered 2012-04-13: 11:00:00 via INTRAVENOUS

## 2012-04-13 NOTE — ED Notes (Signed)
Per patient-upper tooth pain, right side of pain swollen-no trauma, no new meds

## 2012-04-13 NOTE — ED Provider Notes (Signed)
History     CSN: [id number]  Arrival date & time 04/13/12  0810   First MD Initiated Contact with Patient 04/13/12 0830      Chief Complaint  Patient presents with  . Dental Pain  . Facial Swelling    (Consider location/radiation/quality/duration/timing/severity/associated sxs/prior treatment) HPI  Brandon Bartlett is a 37 y.o. male complaining of dental pain to the right upper jaw worsening over the course of 2 days with mild swelling last night. Patient woke up this morning with significant swelling to the right side of the faceand eye. He denies fever, nausea vomiting, difficulty swallowing, shortness of breath, change in vision, pain with eye movement.   Past Medical History  Diagnosis Date  . Arthritis   . Restless leg syndrome   . Sciatic pain     Past Surgical History  Procedure Laterality Date  . Testicle surgery      Family History  Problem Relation Age of Onset  . Cancer Mother     History  Substance Use Topics  . Smoking status: Current Every Day Smoker    Types: Cigarettes  . Smokeless tobacco: Not on file  . Alcohol Use: Yes      Review of Systems  Constitutional: Negative for fever.  HENT: Positive for facial swelling and dental problem.   Respiratory: Negative for shortness of breath.   Cardiovascular: Negative for chest pain.  Gastrointestinal: Negative for nausea, vomiting, abdominal pain and diarrhea.  All other systems reviewed and are negative.    Allergies  Codeine and Penicillins  Home Medications   Current Outpatient Rx  Name  Route  Sig  Dispense  Refill  . HYDROcodone-ibuprofen (VICOPROFEN) 7.5-200 MG per tablet   Oral   Take 1 tablet by mouth every 8 (eight) hours as needed. Pain         . ibuprofen (ADVIL,MOTRIN) 200 MG tablet   Oral   Take 200 mg by mouth every 6 (six) hours as needed for pain.         . Multiple Vitamins-Minerals (ONE-A-DAY MENS HEALTH FORMULA) TABS   Oral   Take 1 tablet by mouth daily.               BP 133/83  Pulse 95  Temp(Src) 99.4 F (37.4 C) (Oral)  Resp 16  SpO2 99%  Physical Exam  Nursing note and vitals reviewed. Constitutional: He is oriented to person, place, and time. He appears well-developed and well-nourished. No distress.  HENT:  Head: Normocephalic.    Significant soft tissue swelling to right side of face in the maxillary area reaching up to both the upper and lower eyelids on the right side; patient is able to open his eyelids  Eyes: Conjunctivae and EOM are normal. Pupils are equal, round, and reactive to light. Right eye exhibits no discharge. Left eye exhibits no discharge.  EOMI with no pain. No conjunctival injection  Neck: Normal range of motion.  Cardiovascular: Normal rate.   Pulmonary/Chest: Effort normal. No stridor.  Musculoskeletal: Normal range of motion.  Lymphadenopathy:    He has cervical adenopathy.  Neurological: He is alert and oriented to person, place, and time.  Psychiatric: He has a normal mood and affect.    ED Course  Procedures (including critical care time)  Labs Reviewed - No data to display No results found.   No diagnosis found.    MDM   Brandon Bartlett is a 37 y.o. male  With significant facial cellulitis likely of  dental origin. Blood work, IV clindamycin and CT with IV contrast ordered. Case signed out to [facility].   Filed Vitals:   04/13/12 0819  BP: 133/83  Pulse: 95  Temp: 99.4 F (37.4 C)  TempSrc: Oral  Resp: 16  SpO2: 99%      Wynetta Emery, PA-C 04/13/12 1616

## 2012-04-13 NOTE — ED Provider Notes (Signed)
Brandon Bartlett is a 37 y.o. male that presents emergency department complaining of dental pain located in the right upper region associated with swelling.  Patient care resumed from [place], [place].  CT maxilla facial pending to rule out possible abscess.  Patient seen and discussed with attending Dr. Judd Lien.  Patient has been treated with pain medication and started on IV clindamycin.  Disposition pending imaging results.  On reevaluation patient is without trismus and pain is been managed thus far in the emergency department. BP 133/83  Pulse 95  Temp(Src) 99.4 F (37.4 C) (Oral)  Resp 16  SpO2 99%   11:27 AM  CT MAXILLOFACIAL WITH CONTRAST Technique: Multidetector CT imaging of the maxillofacial structures was performed with intravenous contrast. Multiplanar CT image reconstructions were also generated. Contrast: OMNIPAQUE IOHEXOL 300 MG/ML SOLN Comparison: Head CT 10/21/2005 Findings: Soft tissue windows demonstrate normal orbits and globes. Normal imaged intracranial contents. Normal submandibular and parotid glands. Mild subcutaneous edema superficial the right maxillary sinus on image 56/series 3. Likely contiguous subcutaneous edema and skin thickening involving the right maxillary and mandibular tissues, including on image 41/series 3. No underlying abscess. Prominent right submandibular and jugular chain nodes are likely reactive. All vascular structures enhance normally. Bone windows demonstrate clear mastoid air cells. Near complete opacification of the right maxillary sinus secondary to mucosal thickening. Mucous retention cyst or polyps within the left maxillary sinus. Mucosal thickening causes partial opacification of ethmoid air cells. Mucosal thickening in the frontal sinuses. Right sphenoid sinus mucous retention cyst or polyp. Poor dentition. Left maxillary peri apical lucency, most apparent on image 50/series 10 sagittal. A right maxillary peri  apical lucency is most apparent on sagittal image 34. Possible more lateral right maxillary peri apical lucency on sagittal image 28. IMPRESSION: Right-sided maxillary and mandibular cellulitis. Concurrent bilateral maxillary periapical dental lucencies. The right maxillary lucency may be the source of the patient's cellulitis. Extensive chronic sinus disease. No evidence of drainable soft tissue abscess.  But results discussed with attending.  Patient to be discharged with close dental followup and placed on clindamycin.  Patient will be given pain medications as well.  Strict return precautions discussed.  Patient verbalizes understanding.   At this time there does not appear to be any evidence of an acute emergency medical condition and the patient appears stable for discharge with appropriate outpatient follow up.Diagnosis was discussed with patient who verbalizes understanding and is agreeable to discharge. Pt case discussed with Dr. Judd Lien who agrees with my plan.      Jaci Carrel, [place] 04/13/12 1128

## 2012-04-13 NOTE — ED Notes (Signed)
Patient transported to CT 

## 2012-04-13 NOTE — ED Notes (Addendum)
Pt escorted to discharge window.  Vitals were stable.  Verbalized understanding discharge instructions. In no acute distress.

## 2012-04-14 NOTE — ED Provider Notes (Signed)
Medical screening examination/treatment/procedure(s) were conducted as a shared visit with non-physician practitioner(s) and myself.  I personally evaluated the patient during the encounter.  The patient presents with dental pain and facial swelling for the past two days.  The swelling is now extending into the right eye.  No fevers or chills.  On exam, the vitals are stable and the patient is afebrile.  The heart and lung exam is unremarkable.  There is facial swelling present extending to the lower eyelid.  There is no pain with eye movements or proptosis.    A CT was performed which does not show orbital involvement or abscess.  He was given iv antibiotics and will be started on oral antibiotics and follow up with dentistry will be arranged.  To return prn.  Geoffery Lyons, MD 04/14/12 [phone]

## 2012-04-14 NOTE — ED Provider Notes (Signed)
Medical screening examination/treatment/procedure(s) were performed by non-physician practitioner and as supervising physician I was immediately available for consultation/collaboration.  Douglas Delo, MD 04/14/12 1541 

## 2013-02-25 ENCOUNTER — Emergency Department (HOSPITAL_BASED_OUTPATIENT_CLINIC_OR_DEPARTMENT_OTHER)
Admission: EM | Admit: 2013-02-25 | Discharge: 2013-02-25 | Disposition: A | Discharge status: Home / Self Care | Payer: Self-pay | Attending: Emergency Medicine | Admitting: Emergency Medicine

## 2013-02-25 ENCOUNTER — Encounter (HOSPITAL_BASED_OUTPATIENT_CLINIC_OR_DEPARTMENT_OTHER): Payer: Self-pay | Admitting: Emergency Medicine

## 2013-02-25 DIAGNOSIS — K029 Dental caries, unspecified: Secondary | ICD-10-CM | POA: Insufficient documentation

## 2013-02-25 DIAGNOSIS — K0889 Other specified disorders of teeth and supporting structures: Secondary | ICD-10-CM

## 2013-02-25 DIAGNOSIS — Z880 Allergy status to penicillin: Secondary | ICD-10-CM | POA: Insufficient documentation

## 2013-02-25 DIAGNOSIS — Z8669 Personal history of other diseases of the nervous system and sense organs: Secondary | ICD-10-CM | POA: Insufficient documentation

## 2013-02-25 DIAGNOSIS — K002 Abnormalities of size and form of teeth: Secondary | ICD-10-CM | POA: Insufficient documentation

## 2013-02-25 DIAGNOSIS — F17200 Nicotine dependence, unspecified, uncomplicated: Secondary | ICD-10-CM | POA: Insufficient documentation

## 2013-02-25 DIAGNOSIS — Z8739 Personal history of other diseases of the musculoskeletal system and connective tissue: Secondary | ICD-10-CM | POA: Insufficient documentation

## 2013-02-25 DIAGNOSIS — K089 Disorder of teeth and supporting structures, unspecified: Secondary | ICD-10-CM | POA: Insufficient documentation

## 2013-02-25 MED ORDER — HYDROCODONE-ACETAMINOPHEN 5-325 MG PO TABS: 2.0000 | ORAL_TABLET | ORAL | Status: DC | PRN

## 2013-02-25 MED ORDER — METRONIDAZOLE 500 MG PO TABS: 500.0000 mg | ORAL_TABLET | Freq: Three times a day (TID) | ORAL | Status: DC

## 2013-02-25 NOTE — ED Provider Notes (Signed)
CSN: [id number]     Arrival date & time 02/25/13  0911 History   None    Chief Complaint  Patient presents with  . Dental Pain   (Consider location/radiation/quality/duration/timing/severity/associated sxs/prior Treatment) HPI Comments: Patient presents to the ER for evaluation of toothache. Patient reports that the last 24 hours he had a onset of severe pain in the left side of his mouth. He has 2 teeth on the upper portion and one on the lower that are bothering him. He has had a long history of dental problems. The patient has not had any fever.  Patient is a 38 y.o. male presenting with tooth pain.  Dental Pain   Past Medical History  Diagnosis Date  . Arthritis   . Restless leg syndrome   . Sciatic pain    Past Surgical History  Procedure Laterality Date  . Testicle surgery     Family History  Problem Relation Age of Onset  . Cancer Mother    History  Substance Use Topics  . Smoking status: Current Every Day Smoker    Types: Cigarettes  . Smokeless tobacco: Not on file  . Alcohol Use: Yes    Review of Systems  HENT: Positive for dental problem.   All other systems reviewed and are negative.    Allergies  Codeine and Penicillins  Home Medications   Current Outpatient Rx  Name  Route  Sig  Dispense  Refill  . Multiple Vitamins-Minerals (ONE-A-DAY MENS HEALTH FORMULA) TABS   Oral   Take 1 tablet by mouth daily.            BP 146/99  Pulse 77  Resp 18  SpO2 100% Physical Exam  Constitutional: He is oriented to person, place, and time. He appears well-developed and well-nourished. No distress.  HENT:  Head: Normocephalic and atraumatic.  Right Ear: Hearing normal.  Left Ear: Hearing normal.  Nose: Nose normal.  Mouth/Throat: Oropharynx is clear and moist and mucous membranes are normal. Abnormal dentition. Dental caries present. No dental abscesses.    Eyes: Conjunctivae and EOM are normal. Pupils are equal, round, and reactive to light.  Neck:  Normal range of motion. Neck supple.  Cardiovascular: Regular rhythm, S1 normal and S2 normal.  Exam reveals no gallop and no friction rub.   No murmur heard. Pulmonary/Chest: Effort normal and breath sounds normal. No respiratory distress. He exhibits no tenderness.  Abdominal: Soft. Normal appearance and bowel sounds are normal. There is no hepatosplenomegaly. There is no tenderness. There is no rebound, no guarding, no tenderness at McBurney's point and negative Murphy's sign. No hernia.  Musculoskeletal: Normal range of motion.  Neurological: He is alert and oriented to person, place, and time. He has normal strength. No cranial nerve deficit or sensory deficit. Coordination normal. GCS eye subscore is 4. GCS verbal subscore is 5. GCS motor subscore is 6.  Skin: Skin is warm, dry and intact. No rash noted. No cyanosis.  Psychiatric: He has a normal mood and affect. His speech is normal and behavior is normal. Thought content normal.    ED Course  Procedures (including critical care time) Labs Review Labs Reviewed - No data to display Imaging Review No results found.  EKG Interpretation   None       MDM  Diagnosis: Dental pain  Resistance to the ER for evaluation of toothache. Patient has widespread dental DKA and extremely poor dentition. There is no obvious abscess, but patient has a history of dental abscess. Will  treat with analgesia and antibiotic coverage.    Gilda Crease, MD 02/25/13 [phone]

## 2013-02-25 NOTE — Discharge Instructions (Signed)
Dental Pain °A tooth ache may be caused by cavities (tooth decay). Cavities expose the nerve of the tooth to air and hot or cold temperatures. It may come from an infection or abscess (also called a boil or furuncle) around your tooth. It is also often caused by dental caries (tooth decay). This causes the pain you are having. °DIAGNOSIS  °Your caregiver can diagnose this problem by exam. °TREATMENT  °· If caused by an infection, it may be treated with medications which kill germs (antibiotics) and pain medications as prescribed by your caregiver. Take medications as directed. °· Only take over-the-counter or prescription medicines for pain, discomfort, or fever as directed by your caregiver. °· Whether the tooth ache today is caused by infection or dental disease, you should see your dentist as soon as possible for further care. °SEEK MEDICAL CARE IF: °The exam and treatment you received today has been provided on an emergency basis only. This is not a substitute for complete medical or dental care. If your problem worsens or new problems (symptoms) appear, and you are unable to meet with your dentist, call or return to this location. °SEEK IMMEDIATE MEDICAL CARE IF:  °· You have a fever. °· You develop redness and swelling of your face, jaw, or neck. °· You are unable to open your mouth. °· You have severe pain uncontrolled by pain medicine. °MAKE SURE YOU:  °· Understand these instructions. °· Will watch your condition. °· Will get help right away if you are not doing well or get worse. °Document Released: 01/23/2005 Document Revised: 04/17/2011 Document Reviewed: 09/11/2007 °ExitCare® Patient Information ©2014 [employer]. °  Emergency Department Resource Guide °1) Find a Doctor and Pay Out of Pocket °Although you won't have to find out who is covered by your insurance plan, it is a good idea to ask around and get recommendations. You will then need to call the office and see if the doctor you have chosen will  accept you as a new patient and what types of options they offer for patients who are self-pay. Some doctors offer discounts or will set up payment plans for their patients who do not have insurance, but you will need to ask so you aren't surprised when you get to your appointment. ° °2) Contact Your Local Health Department °Not all health departments have doctors that can see patients for sick visits, but many do, so it is worth a call to see if yours does. If you don't know where your local health department is, you can check in your phone book. The CDC also has a tool to help you locate your state's health department, and many state websites also have listings of all of their local health departments. ° °3) Find a Walk-in Clinic °If your illness is not likely to be very severe or complicated, you may want to try a walk in clinic. These are popping up all over the country in pharmacies, drugstores, and shopping centers. They're usually staffed by nurse practitioners or physician assistants that have been trained to treat common illnesses and complaints. They're usually fairly quick and inexpensive. However, if you have serious medical issues or chronic medical problems, these are probably not your best option. ° °No Primary Care Doctor: °- Call [facility] at  832-8000 - they can help you locate a primary care doctor that  accepts your insurance, provides certain services, etc. °- Physician Referral Service- 1-800-533-3463 ° °Chronic Pain Problems: °Organization           Address  Phone   Notes  ° [facility]  (336) 297-2271 Patients need to be referred by their primary care doctor.  ° °Medication Assistance: °Organization         Address  Phone   Notes  °[place] [facility] [address] °[place], [place] [address] (336) 641-8030 --Must be a resident of [place] °-- Must have NO insurance coverage whatsoever (no Medicaid/ Medicare, etc.) °-- The pt.  MUST have a primary care doctor that directs their care regularly and follows them in the community °  °[facility]  (866) 331-1348   °[employer]  (888) 892-1162   ° °Agencies that provide inexpensive medical care: °Organization         Address  Phone   Notes  °[facility]  (336) 832-8035   °[facility]    (336) 832-7272   °[facility] [address] °[place], [place] [address] (336) 832-4777   °[facility] [address], °[place] (336) 271-4999   °[facility]    (336) 373-0678   °[facility]    (336) 272-1050   °[facility] ° [address], [place] Phone:  (336) 832-4444, Fax:  (336) 832-4440 Hours of Operation:  9 am - 6 pm, M-F.  Also accepts Medicaid/Medicare and self-pay.  °[facility] ° [address], [place] Phone: (336) 832-3150, Fax: (336) 832-3151. Hours of Operation:  8:30 am - 5:30 pm, M-F.  Also accepts Medicaid and self-pay.  °[facility] [place] [address], [place] Phone: (336) 878-6027   °[facility] [address], [place], [place] (336)723-1848, Ext. 123 Mondays & Thursdays: 7-9 AM.  First 15 patients are seen on a first come, first serve basis. °  ° °Medicaid-accepting [place] Providers: ° °Organization         Address  Phone   Notes  °[facility] [address], [place] (336) 641-2100 Also accepts self-pay patients.  °[facility] [address], [place] ° (336) 856-9996   °[facility] [address], [place] (336) 288-8857   °[facility] [address] [place] [address], [place] (336) 299-7000   °Veita Bland [address], [place]  ° (336) 373-1557 Only accepts [place] Access Medicaid patients after they have their name applied to their card.  ° °Self-Pay (no insurance) in  [place]: ° °Organization         Address  Phone   Notes  °Sickle Cell Patients, [facility] [address], [place] (336) 832-1970   °[facility] [address], [place] (336) 832-4400   °[facility] ° [address], [place] (336) 992-4800   °[facility]/Dr. Osei-Bonsu ° [address] [place] [address], [place] or [address], [place] (336) 841-8500 Phone number for both [place] and [place] locations is the same.  °[facility] [address], [place] (336) 299-0000   °[facility] [address] [place] [address], [place] or [address] (336) 852-7530 °(336) 878-2260   °[facility] [address],  (336) 350-1642, phone; (336) 294-5005, fax Sees patients 1st and 3rd Saturday of every month.  Must not qualify   for public or private insurance (i.e. Medicaid, Medicare, [place] Health Choice, Veterans' Benefits) • Household income should be no more than 200% of the poverty level •The clinic cannot treat you if you are pregnant or think you are pregnant • Sexually transmitted diseases are not treated at the clinic.  ° °Dental Care: °Organization         Address  Phone  Notes  °[facility] [address], [place] (336) 641-6152 Accepts children up to age 21 who are enrolled in Medicaid or [place] Health Choice; pregnant women with a Medicaid card; and children who have applied for Medicaid or [place] Health Choice, but were declined, whose parents can pay a reduced fee at time of service.  °[facility] [place]  [address], [place] (336) 641-7733 Accepts children up to age 21 who are enrolled in Medicaid or [place] Health Choice; pregnant women with a Medicaid card; and children who have applied for Medicaid or [place] Health Choice, but were declined, whose parents  can pay a reduced fee at time of service.  °[facility] ° [address], [place] (336) 641-4533 Patients are seen by appointment only. Walk-ins are not accepted. [facility] will see patients 18 years of age and older. °Monday - Tuesday (8am-5pm) °Most Wednesdays (8:30-5pm) °$30 per visit, cash only  °[facility] ° [address], [place] (336) 641-4533 Patients are seen by appointment only. Walk-ins are not accepted. [facility] will see patients 18 years of age and older. °One Wednesday Evening (Monthly: Volunteer Based).  $30 per visit, cash only  °[facility]  (919) 537-3737 for adults; Children under age 4, call Graduate Pediatric Dentistry at ([phone]) [phone]. Children aged 4-14, please call (919) [phone] to request a pediatric application. ° Dental services are provided in all areas of dental care including fillings, crowns and bridges, complete and partial dentures, implants, gum treatment, root canals, and extractions. Preventive care is also provided. Treatment is provided to both adults and children. °Patients are selected via a lottery and there is often a waiting list. °  °[facility] [address], °[place] ° (336) 763-8833 [email or web address] °  °[facility] [address], [place], [place] (336)723-1848, Ext. 123 Second and Fourth Thursday of each month, opens at 6:30 AM; Clinic ends at 9 AM.  Patients are seen on a first-come first-served basis, and a limited number are seen during each clinic.  ° °[facility] ° [address], [place], [place] (336) 723-7904   Eligibility Requirements °You must have lived in [place], [place], or [place] counties for at least the last three months. °  You cannot be eligible for state or federal sponsored healthcare insurance, including Veterans Administration, Medicaid, or Medicare. °  You generally cannot be eligible for healthcare  insurance through your employer.  °  How to apply: °Eligibility screenings are held every Tuesday and Wednesday afternoon from 1:00 pm until 4:00 pm. You do not need an appointment for the interview!  °[facility] [address], [place], [place] 336-631-2330   °[facility]  336-342-8273   °[facility]  336-703-3100   °[facility]  336-570-6415   ° °Behavioral Health Resources in the Community: °Intensive Outpatient Programs °Organization         Address  Phone    Notes  °[facility] [address], [place], [place] 336-878-6098   °[facility] [address], [place], [place] 336-832-9800   °[facility] [address], [place], [place] ° 336-882-2125   °[facility] [address],  °[place], [place] 1-800-853-5163 or 336-641-4981   °Substance Abuse Resources °Organization         Address  Phone  Notes  °[facility]  336-882-2125   °[facility]  336-784-9470   °[facility]  336-285-9073   °[facility]  336-845-3988   °Residential & Outpatient Substance Abuse Program  1-800-659-3381   °Psychological Services °Organization         Address  Phone  Notes  °[facility]  336- 832-9600   °[facility]  336- 378-7881   °[facility] [address], [place] 1-800-853-5163 or 336-641-4981   ° °Mobile Crisis Teams °Organization         Address  Phone  Notes  °[facility], Mobile Crisis Care Unit  1-877-626-1772   °Assertive °[facility] ° [address] [place], [place] 336-834-9664   °Sharon DeEsch [address] °[place] 336-554-5454   ° °Self-Help/Support Groups °Organization         Address  Phone             Notes  °[facility] - variety of support groups  336- 373-1402 Call for more information  °Narcotics Anonymous (NA),  [facility] [address], °[place]  2 meetings at this location  ° °Residential Treatment Programs °Organization         Address  Phone  Notes  °[facility] [address],    °[place]  1-866-801-8205   °[facility] ° [address], [place], [place] 704-293-8524   °[facility] [address], [place] 336-845-3988 Admissions: 8am-3pm M-F  °[facility] [address]    °[place], [place] 336-841-1104   °[facility] [address], [place], [place] 336-379-7146   °[facility] [address]  °[place], [place] 336-285-9073   °[facility] Intensive Outpatient [address], [place], [place] 336-852-3033   °[facility] [address]  °[place], [place] 1-877-615-2722 or 336-784-9470   °[facility] [address] [place], [place] 336-227-7417 Accepts Medicaid  °[facility] [address]  ° [place] 1-800-659-3381 Substance Abuse/Addiction Treatment  ° °[place] Behavioral Health Resources °Organization         Address  Phone  Notes  °[facility]  (888) 581-9988   °Julie Brannon, PhD [address] [place], [place]   (336) 349-5553 or (336) 951-0000   °[facility]   [address] °[place], [place] (336) 349-4454   °[facility] [address], [place], [place] (336) 342-8316 Insurance/Medicaid/sponsorship through [facility]  °[facility] [address]                                    [place], [place] (336) 342-8316 Therapy/tele-psych/case  °[facility] [address]  ° [place], [place] (336) 349-2233    °Dr. Arfeen  (336) 349-4544   °[facility] [place]  [employer] [place]   [facility] 1) [address], [place] °2) [address], [place] °3)  [address], [place] (336) 349-3220 °(336) 342-7768 ° °(336) 342-8140     °[place] [facility] (336) 342-1394 or (336) 342-3537 (After Hours)    ° °   °

## 2013-02-25 NOTE — ED Notes (Signed)
Several months of dental issues reports onset of headache and upper left jaw/molar pain since last night.

## 2013-04-21 ENCOUNTER — Emergency Department (HOSPITAL_COMMUNITY)
Admission: EM | Admit: 2013-04-21 | Discharge: 2013-04-21 | Disposition: A | Discharge status: Home / Self Care | Payer: Self-pay | Attending: Emergency Medicine | Admitting: Emergency Medicine

## 2013-04-21 ENCOUNTER — Encounter (HOSPITAL_COMMUNITY): Payer: Self-pay | Admitting: Emergency Medicine

## 2013-04-21 DIAGNOSIS — Z8669 Personal history of other diseases of the nervous system and sense organs: Secondary | ICD-10-CM | POA: Insufficient documentation

## 2013-04-21 DIAGNOSIS — M542 Cervicalgia: Secondary | ICD-10-CM | POA: Insufficient documentation

## 2013-04-21 DIAGNOSIS — M129 Arthropathy, unspecified: Secondary | ICD-10-CM | POA: Insufficient documentation

## 2013-04-21 DIAGNOSIS — K029 Dental caries, unspecified: Secondary | ICD-10-CM | POA: Insufficient documentation

## 2013-04-21 DIAGNOSIS — Z880 Allergy status to penicillin: Secondary | ICD-10-CM | POA: Insufficient documentation

## 2013-04-21 DIAGNOSIS — M2560 Stiffness of unspecified joint, not elsewhere classified: Secondary | ICD-10-CM

## 2013-04-21 DIAGNOSIS — F17200 Nicotine dependence, unspecified, uncomplicated: Secondary | ICD-10-CM | POA: Insufficient documentation

## 2013-04-21 DIAGNOSIS — M5430 Sciatica, unspecified side: Secondary | ICD-10-CM | POA: Insufficient documentation

## 2013-04-21 DIAGNOSIS — K089 Disorder of teeth and supporting structures, unspecified: Secondary | ICD-10-CM | POA: Insufficient documentation

## 2013-04-21 DIAGNOSIS — R110 Nausea: Secondary | ICD-10-CM | POA: Insufficient documentation

## 2013-04-21 DIAGNOSIS — K0889 Other specified disorders of teeth and supporting structures: Secondary | ICD-10-CM

## 2013-04-21 DIAGNOSIS — M2569 Stiffness of other specified joint, not elsewhere classified: Secondary | ICD-10-CM | POA: Insufficient documentation

## 2013-04-21 DIAGNOSIS — Z79899 Other long term (current) drug therapy: Secondary | ICD-10-CM | POA: Insufficient documentation

## 2013-04-21 MED ORDER — MELOXICAM 7.5 MG PO TABS: 7.5000 mg | ORAL_TABLET | Freq: Every day | ORAL | Status: DC

## 2013-04-21 MED ORDER — BENZOCAINE 10 % MT GEL: 1.0000 "application " | OROMUCOSAL | Status: DC | PRN

## 2013-04-21 MED ORDER — CLINDAMYCIN HCL 150 MG PO CAPS: 450.0000 mg | ORAL_CAPSULE | Freq: Three times a day (TID) | ORAL | Status: DC

## 2013-04-21 NOTE — ED Notes (Signed)
Patient states having dental pain in L upper back of mouth. Patient has L facial swelling.

## 2013-04-21 NOTE — Progress Notes (Signed)
[facility] provided pt with a list of primary care resources, dental resources, and a [facility] [place] Card application to help patient establish primary care.  °

## 2013-04-21 NOTE — Discharge Instructions (Signed)
Please call and set up an appointment with dentist and oral surgeon-to swallow to be removed Please take antibiotics as prescribed for infection prevention Please rest and stay hydrated Please take medications as prescribed Please continue to monitor symptoms closely and if symptoms are to worsen or change (fever greater than 101, chills, chest pain, shortness of breath, difficulty breathing, nausea, vomiting, diarrhea, swelling to the face, numbness, tingling, inability open and close the jaw line, inability to swallow, visual changes) please report back to the ED immediately   Dental Care and Dentist Visits Dental care supports good overall health. Regular dental visits can also help you avoid dental pain, bleeding, infection, and other more serious health problems in the future. It is important to keep the mouth healthy because diseases in the teeth, gums, and other oral tissues can spread to other areas of the body. Some problems, such as diabetes, heart disease, and pre-term labor have been associated with poor oral health.  See your dentist every 6 months. If you experience emergency problems such as a toothache or broken tooth, go to the dentist right away. If you see your dentist regularly, you may catch problems early. It is easier to be treated for problems in the early stages.  WHAT TO EXPECT AT A DENTIST VISIT  Your dentist will look for many common oral health problems and recommend proper treatment. At your regular dental visit, you can expect:  Gentle cleaning of the teeth and gums. This includes scraping and polishing. This helps to remove the sticky substance around the teeth and gums (plaque). Plaque forms in the mouth shortly after eating. Over time, plaque hardens on the teeth as tartar. If tartar is not removed regularly, it can cause problems. Cleaning also helps remove stains.  Periodic X-rays. These pictures of the teeth and supporting bone will help your dentist assess the  health of your teeth.  Periodic fluoride treatments. Fluoride is a natural mineral shown to help strengthen teeth. Fluoride treatmentinvolves applying a fluoride gel or varnish to the teeth. It is most commonly done in children.  Examination of the mouth, tongue, jaws, teeth, and gums to look for any oral health problems, such as:  Cavities (dental caries). This is decay on the tooth caused by plaque, sugar, and acid in the mouth. It is best to catch a cavity when it is small.  Inflammation of the gums caused by plaque buildup (gingivitis).  Problems with the mouth or malformed or misaligned teeth.  Oral cancer or other diseases of the soft tissues or jaws. KEEP YOUR TEETH AND GUMS HEALTHY For healthy teeth and gums, follow these general guidelines as well as your dentist's specific advice:  Have your teeth professionally cleaned at the dentist every 6 months.  Brush twice daily with a fluoride toothpaste.  Floss your teeth daily.  Ask your dentist if you need fluoride supplements, treatments, or fluoride toothpaste.  Eat a healthy diet. Reduce foods and drinks with added sugar.  Avoid smoking. TREATMENT FOR ORAL HEALTH PROBLEMS If you have oral health problems, treatment varies depending on the conditions present in your teeth and gums.  Your caregiver will most likely recommend good oral hygiene at each visit.  For cavities, gingivitis, or other oral health disease, your caregiver will perform a procedure to treat the problem. This is typically done at a separate appointment. Sometimes your caregiver will refer you to another dental specialist for specific tooth problems or for surgery. SEEK IMMEDIATE DENTAL CARE IF:  You have  pain, bleeding, or soreness in the gum, tooth, jaw, or mouth area.  A permanent tooth becomes loose or separated from the gum socket.  You experience a blow or injury to the mouth or jaw area. Document Released: 10/05/2010 Document Revised:  04/17/2011 Document Reviewed: 10/05/2010 [facility] Patient Information 2014 [place], [place].  Dental Pain Toothache is pain in or around a tooth. It may get worse with chewing or with cold or heat.  HOME CARE  Your dentist may use a numbing medicine during treatment. If so, you may need to avoid eating until the medicine wears off. Ask your dentist about this.  Only take medicine as told by your dentist or doctor.  Avoid chewing food near the painful tooth until after all treatment is done. Ask your dentist about this. GET HELP RIGHT AWAY IF:   The problem gets worse or new problems appear.  You have a fever.  There is redness and puffiness (swelling) of the face, jaw, or neck.  You cannot open your mouth.  There is pain in the jaw.  There is very bad pain that is not helped by medicine. MAKE SURE YOU:   Understand these instructions.  Will watch your condition.  Will get help right away if you are not doing well or get worse. Document Released: 07/12/2007 Document Revised: 04/17/2011 Document Reviewed: 07/12/2007 [facility] Patient Information 2014 [place], [place].   Emergency Department Resource Guide 1) Find a Doctor and Pay Out of Pocket Although you won't have to find out who is covered by your insurance plan, it is a good idea to ask around and get recommendations. You will then need to call the office and see if the doctor you have chosen will accept you as a new patient and what types of options they offer for patients who are self-pay. Some doctors offer discounts or will set up payment plans for their patients who do not have insurance, but you will need to ask so you aren't surprised when you get to your appointment.  2) Contact Your Local Health Department Not all health departments have doctors that can see patients for sick visits, but many do, so it is worth a call to see if yours does. If you don't know where your local health department is, you can check in your  phone book. The CDC also has a tool to help you locate your state's health department, and many state websites also have listings of all of their local health departments.  3) Find a Walk-in Clinic If your illness is not likely to be very severe or complicated, you may want to try a walk in clinic. These are popping up all over the country in pharmacies, drugstores, and shopping centers. They're usually staffed by nurse practitioners or physician assistants that have been trained to treat common illnesses and complaints. They're usually fairly quick and inexpensive. However, if you have serious medical issues or chronic medical problems, these are probably not your best option.  No Primary Care Doctor: - Call Health Connect at  [phone] - they can help you locate a primary care doctor that  accepts your insurance, provides certain services, etc. - Physician Referral Service- [phone]  Chronic Pain Problems: Organization         Address  Phone   Notes  [facility]  [phone] Patients need to be referred by their primary care doctor.   Medication Assistance: Retail buyer  Notes  [facility] [facility] Program [address] [place], [place] [address] [phone] --Must be a resident of [place] -- Must have NO insurance coverage whatsoever (no Medicaid/ Medicare, etc.) -- The pt. MUST have a primary care doctor that directs their care regularly and follows them in the community   MedAssist  [phone]) [phone]   [employer]  [phone]) [phone]    Agencies that provide inexpensive medical care: Organization         Address  Phone   Notes  [facility]  [phone]) [phone]   [facility]    [phone]   [facility] [address] [place], [place] [address] [phone]) [phone]   [facility] [place] [address], [place] [phone]) [phone]   [facility]    [phone]   [facility]    [phone]   [facility]  [address], [place] Phone:  [phone]) [phone], Fax:  [phone] Hours of Operation:  9 am - 6 pm, M-F.  Also accepts Medicaid/Medicare and self-pay.  [facility]  [address], [place] Phone: [phone](336) [phone], Fax: [phone]. Hours of Operation:  8:30 am - 5:30 pm, M-F.  Also accepts Medicaid and self-pay.  [facility] [place] [address], [place] Phone: [phone]) [phone]   [facility] [address], [place], [place] [phone], Ext. [phone] Mondays & Thursdays: 7-9 AM.  First 15 patients are seen on a first come, first serve basis.    Medicaid-accepting [facility] Providers:  Organization         Address  Phone   Notes  [facility] [address], [place] [phone]) [phone] Also accepts self-pay patients.  [facility] [address] [place] [place], [place]  [phone]) [phone]   [facility] [address], [place] [phone]) [phone]   [facility] [address] [place] [address], [place] [phone]) [phone]   Renaye RakersVeita Bland [address], [place]   [phone] Only accepts [place] Access [place] patients after they have their name applied to their card.   Self-Pay (no insurance) in [facility] [place]:  Organization         Address  Phone   Notes  Sickle Cell Patients, [facility] [address], [place] [phone]   [facility] [address], [place] [phone]) [phone]   [facility] [place]  [address], [place] [phone]   [facility]/Dr. Osei-Bonsu  [address] [place] [address], [place] or [phone], [place] [phone]) [phone] Phone number for both [place] and [place] locations is the same.    [facility] [address], [place] [phone]   [facility] [place] [address] [place] [address], [place] or [address] [phone]   [facility] [address], [place] [phone]) [phone], phone; [phone]) [phone], fax Sees patients 1st and 3rd Saturday of every month.  Must not qualify for public or private insurance (i.e. Medicaid, Medicare, [place] Health Choice, Veterans' Benefits)  Household income should be no more than 200% of the poverty level The clinic cannot treat you if you are pregnant or think you are pregnant  Sexually  transmitted diseases are not treated at the clinic.    Dental Care: Organization         Address  Phone  Notes  [facility] of [facility] [address] [place], [place] [phone] Accepts children up to age 87 who are enrolled in [place] or [place] Health Choice; pregnant women with a Medicaid card; and children who have applied for Medicaid or [place] Health Choice, but were declined, whose parents can pay a reduced fee at time of service.  [facility] of [facility]  [address], [place] [phone] Accepts children up to age 2 who are enrolled in [place] or [place] Health Choice; pregnant women with a Medicaid card; and children who have applied for Medicaid or [place] Health Choice, but were declined, whose parents can pay a reduced fee at time of service.  [facility]  [address] [place], [place] [phone] Patients are seen by appointment only. Walk-ins are not accepted. [facility] will see patients 82 years of age and older. Monday - Tuesday (8am-5pm) Most Wednesdays (8:30-5pm) $30 per visit, cash only  [facility] Adult Dental Access PROGRAM  [address], [facility] [phone] Patients are seen by appointment only. Walk-ins are not accepted. [facility] will see patients 23  years of age and older. One Wednesday Evening (Monthly: Volunteer Based).  $30 per visit, cash only  [employer]  [phone] for adults; Children under age 69, call Graduate Pediatric Dentistry at [phone]. Children aged 45-14, please call [phone] to request a pediatric application.  Dental services are provided in all areas of dental care including fillings, crowns and bridges, complete and partial dentures, implants, gum treatment, root canals, and extractions. Preventive care is also provided. Treatment is provided to both adults and children. Patients are selected via a lottery and there is often a waiting list.   [facility] [address] [place]  [phone] [email or web address]   [facility] [address] [place], [place] [phone], Ext. [phone] Second and Fourth Thursday of each month, opens at 6:30 AM; Clinic ends at 9 AM.  Patients are seen on a first-come first-served basis, and a limited number are seen during each clinic.   [facility]  [address]ins [place], [place] [phone]   Eligibility Requirements You must have lived in [place], [place], or [place] counties for at least the last three months.   You cannot be eligible for state or federal sponsored [place], including [employer], [place], or [employer].   You generally cannot be eligible for healthcare insurance through your employer.    How to apply: Eligibility screenings are held every Tuesday and Wednesday afternoon from 1:00 pm until 4:00 pm. You do not need an appointment for the interview!  [facility] [address] [place], [place] [phone]   [facility] Health Department  [phone]   [facility] Health Department  [phone]   [facility] Health Department  [phone]    Behavioral Health Resources in the Community: Intensive Outpatient  Programs Organization         Address  Phone  Notes  [facility] Services [address] [place], [place] [phone]   [facility]patient [address] [place], [place] [phone]   [facility] [address] [place], [place]  [phone]   [facility] Mental Health [address],  [place], [place] [phone] or [phone]   Substance Abuse Resources Organization         Address  Phone  Notes  [facility] Drug Services  [phone]   [facility]  [phone]   [facility] Maria Stein  [phone]   Floydene Flock  [phone]   Residential & Outpatient Substance Abuse Program  [phone])[phone]   Psychological Services Organization         Address  Phone  Notes  [facility] Health  [phone]   [facility] Services  [phone]   [facility] Health [address] [place] [phone] or [phone]    Mobile Crisis Teams Organization         Address  Phone  Notes  [facility], Mobile Crisis Care Unit  [phone]   [facility]  [address] [place], [place] [phone]   Doristine Locks [address] [place] [phone]    Self-Help/Support Groups Organization         Address  Phone             Notes  [facility] [place] - variety of support groups  [phone]- I7437963 Call for more information  Narcotics Anonymous (NA), [facility] [address], [employer] [place]  2 meetings at this location   Statistician         Address  Phone  Notes  [facility] [address],    [place]  [phone]   [facility]  [address] [place] [address], [place], [place] [phone]   [facility] [address] [place], [place] [phone] Admissions: 8am-3pm M-F  [facility] [address]    [place], [place]  [phone]   [facility] [address] [place], [place], [place] [phone]   [facility] [address]  [place], [place] [phone]   [facility] - Intensive Outpatient [address], [place], [place] [phone]   [facility] ([facility] [address]  [place], [place] [phone] or [phone]) [phone]   [facility] ([facility]) [address] [place], [place] [phone] Accepts Medicaid  [facility] [place] [address],  [place] [phone] Substance Abuse/Addiction Treatment   [facility] Organization         Address  Phone  Notes  [facility]  [phone]   Angie Fava, PhD [address]vin Knack [place], [place]   [phone] or [phone]   [facility] Behavioral   [address] [place], [place] [phone]   [facility] [address] [place], [place] [phone] Insurance/Medicaid/sponsorship through [facility] and Families [address]                                    [place], [place] [phone] Therapy/tele-psych/case  [facility] [address], [place] [phone]    Dr. Lolly Mustache  [phone]   [facility] [place]  [employer] [facility] Dept. 1) [address] [place] 2) [address] [place] 3)  [address], [place] [phone]  [phone]   [facility] Child Abuse Hotline (934)280-5612 or (808)584-6637 (  After Hours)    ° ° ° °

## 2013-04-21 NOTE — ED Provider Notes (Signed)
CSN: [id number]     Arrival date & time 04/21/13  1256 History   First MD Initiated Contact with Patient 04/21/13 1325 This chart was scribed for non-physician practitioner Raymon Mutton, PA-C working with Lyanne Co, MD by Valera Castle, ED scribe. This patient was seen in room WTR8/WTR8 and the patient's care was started at 2:25 PM.     Chief Complaint  Patient presents with  . Dental Pain   (Consider location/radiation/quality/duration/timing/severity/associated sxs/prior Treatment) The history is provided by the patient. No language interpreter was used.   HPI Comments: Brandon Bartlett is a 38 y.o. male who presents to the Emergency Department complaining of constant, throbbing, right, upper dental pain, with associated nausea, left neck pain and stiffness, onset yesterday afternoon. He has taken Ibuprofen for the pain without relief. He states he last visited a dentist 2 years ago. Pt was seen for similar dental pain 02/2013. He states he did not have success with calling referred dentists. He denies drainage, bleeding, and any other associated symptoms. He has allergy to Penicillin, told by his mother when he was young.   PCP - No primary provider on file.  Past Medical History  Diagnosis Date  . Arthritis   . Restless leg syndrome   . Sciatic pain    Past Surgical History  Procedure Laterality Date  . Testicle surgery     Family History  Problem Relation Age of Onset  . Cancer Mother    History  Substance Use Topics  . Smoking status: Current Every Day Smoker    Types: Cigarettes  . Smokeless tobacco: Not on file  . Alcohol Use: Yes    Review of Systems  Constitutional: Negative for fever, chills, diaphoresis and appetite change.  HENT: Positive for dental problem (back upper left) and facial swelling (left). Negative for trouble swallowing.   Respiratory: Negative for shortness of breath.   Cardiovascular: Negative for chest pain.  Gastrointestinal:  Positive for nausea. Negative for vomiting, abdominal pain, diarrhea and constipation.  Genitourinary: Negative for dysuria and difficulty urinating.  Musculoskeletal: Positive for neck pain (left) and neck stiffness.  All other systems reviewed and are negative.   Allergies  Codeine and Penicillins  Home Medications   Current Outpatient Rx  Name  Route  Sig  Dispense  Refill  . ibuprofen (ADVIL,MOTRIN) 200 MG tablet   Oral   Take 400 mg by mouth every 6 (six) hours as needed.         . Multiple Vitamins-Minerals (ONE-A-DAY MENS HEALTH FORMULA) TABS   Oral   Take 1 tablet by mouth daily.           . benzocaine (ORAJEL) 10 % mucosal gel   Mouth/Throat   Use as directed 1 application in the mouth or throat as needed for mouth pain.   5.3 g   0   . clindamycin (CLEOCIN) 150 MG capsule   Oral   Take 3 capsules (450 mg total) by mouth 3 (three) times daily.   90 capsule   0   . meloxicam (MOBIC) 7.5 MG tablet   Oral   Take 1 tablet (7.5 mg total) by mouth daily.   15 tablet   0    BP 128/78  Pulse 84  Temp(Src) 98.2 F (36.8 C) (Oral)  Resp 14  SpO2 99%  Physical Exam  Nursing note and vitals reviewed. Constitutional: He is oriented to person, place, and time. He appears well-developed and well-nourished. No distress.  HENT:  Head: Normocephalic and atraumatic.  Mouth/Throat: Oropharynx is clear and moist. No trismus in the jaw. Dental caries present. No dental abscesses or uvula swelling. No posterior oropharyngeal edema, posterior oropharyngeal erythema or tonsillar abscesses.    Mild swelling localized to the left maxillary jawline with negative erythema, inflammation, warmth upon palpation. Poor dentition identified with numerous teeth missing-remaining teeth decayed and diagrammed. Diagrammed and decaying left second premolar with negative swelling, erythema, inflammation, bleeding, drainage noted to the gumline. Negative findings of periapical abscess.  Negative trismus. Negative sublingual lesions. Uvula midline with symmetrical elevation. Negative uvula swelling.  Eyes: Conjunctivae and EOM are normal. Pupils are equal, round, and reactive to light. Right eye exhibits no discharge. Left eye exhibits no discharge.  Neck: Normal range of motion. Neck supple. No tracheal deviation present.  Negative neck stiffness Negative nuchal rigidity Negative cervical lymphadenopathy  Cardiovascular: Normal rate, regular rhythm and normal heart sounds.  Exam reveals no friction rub.   No murmur heard. Pulses:      Radial pulses are 2+ on the right side, and 2+ on the left side.  Pulmonary/Chest: Effort normal and breath sounds normal. No respiratory distress. He has no wheezes. He has no rales.  Negative stridor Patient stated to speak in full sentences without difficulty Negative hot potato voice or muffled voice  Musculoskeletal: Normal range of motion.  Full ROM to upper and lower extremities without difficulty noted, negative ataxia noted.  Lymphadenopathy:    He has no cervical adenopathy.  Neurological: He is alert and oriented to person, place, and time. No cranial nerve deficit. He exhibits normal muscle tone. Coordination normal.  Cranial nerves III-XII grossly intact  Skin: Skin is warm and dry. No rash noted. No erythema.  Psychiatric: He has a normal mood and affect. His behavior is normal.    ED Course  Procedures (including critical care time)  DIAGNOSTIC STUDIES: Oxygen Saturation is 99% on room air, normal by my interpretation.    COORDINATION OF CARE: 2:31 PM-Discussed treatment plan which includes Clindamycin, Mobic, and numbing medication with pt at bedside and pt agreed to plan.   Labs Review Labs Reviewed - No data to display Imaging Review No results found.   EKG Interpretation None     Medications - No data to display MDM   Final diagnoses:  Pain, dental    Filed Vitals:   04/21/13 1312 04/21/13 1314  04/21/13 1452  BP:  128/78 128/73  Pulse:  84 70  Temp:  98.2 F (36.8 C)   TempSrc:  Oral   Resp:  14 16  SpO2: 99% 99% 98%   I personally performed the services described in this documentation, which was scribed in my presence. The recorded information has been reviewed and is accurate.  Patient presenting to the ED with dental pain that started yesterday at approximately 12:00 PM localized to the left maxillary jawline described as a constant throbbing sensation with radiation to the left of the neck. Stated that he's been feeling nauseous. Reported that he's been using ibuprofen with minimal relief. Stated that he has not seen a dentist within the past 2 years. This provider reviewed patient's chart. Patient is been seen numerous times in ED setting regarding dental pain. Last visit was in 02/25/2013 regarding dental pain. Alert and oriented. GCS 15. Heart rate and rhythm normal. Lungs clear to auscultation to upper and lower lobes bilaterally. Negative cervical lymphadenopathy. Negative neck stiffness, negative nuchal rigidity-negative meningeal signs. Mild facial swelling identified to the left  maxillary jawline with mild discomfort upon palpation-negative warmth upon palpation. Poor dentition identified with numerous teeth missing-remaining teeth are diagrammed and decayed. Discomfort upon palpation to left second premolar of maxillary jawline-negative signs of periapical abscess. Negative uvula swelling, uvula midline with symmetrical elevation. Negative trismus. Negative sublingual lesions. Doubt peritonsillar abscess. Doubt retropharyngeal abscess. Doubt Ludwig's angina. Suspicion to be dental pain secondary to poor dentition. Patient has long history of dental pain with poor dental followup. Patient stable, afebrile. Patient stable nonseptic-appearing. Discharge patient. Discharge patient with antibiotics, anti-inflammatories. Referred patient to dentist. Discussed with patient to rest and  stay hydrated. Discussed with patient to closely monitor symptoms and if symptoms are to worsen or change to report back to the ED - strict return instructions given.  Patient agreed to plan of care, understood, all questions answered.    Raymon Mutton, PA-C 04/22/13 [phone]

## 2013-04-22 NOTE — ED Provider Notes (Signed)
Medical screening examination/treatment/procedure(s) were performed by non-physician practitioner and as supervising physician I was immediately available for consultation/collaboration.   EKG Interpretation None        Johnny Latu M Tej Murdaugh, MD 04/22/13 1638 

## 2013-05-31 ENCOUNTER — Emergency Department (HOSPITAL_BASED_OUTPATIENT_CLINIC_OR_DEPARTMENT_OTHER)
Admission: EM | Admit: 2013-05-31 | Discharge: 2013-06-01 | Disposition: A | Discharge status: Home / Self Care | Payer: Self-pay | Attending: Emergency Medicine | Admitting: Emergency Medicine

## 2013-05-31 ENCOUNTER — Encounter (HOSPITAL_BASED_OUTPATIENT_CLINIC_OR_DEPARTMENT_OTHER): Payer: Self-pay | Admitting: Emergency Medicine

## 2013-05-31 DIAGNOSIS — K089 Disorder of teeth and supporting structures, unspecified: Secondary | ICD-10-CM | POA: Insufficient documentation

## 2013-05-31 DIAGNOSIS — F17200 Nicotine dependence, unspecified, uncomplicated: Secondary | ICD-10-CM | POA: Insufficient documentation

## 2013-05-31 DIAGNOSIS — K029 Dental caries, unspecified: Secondary | ICD-10-CM | POA: Insufficient documentation

## 2013-05-31 DIAGNOSIS — Z880 Allergy status to penicillin: Secondary | ICD-10-CM | POA: Insufficient documentation

## 2013-05-31 DIAGNOSIS — R599 Enlarged lymph nodes, unspecified: Secondary | ICD-10-CM | POA: Insufficient documentation

## 2013-05-31 DIAGNOSIS — Z8669 Personal history of other diseases of the nervous system and sense organs: Secondary | ICD-10-CM | POA: Insufficient documentation

## 2013-05-31 DIAGNOSIS — M129 Arthropathy, unspecified: Secondary | ICD-10-CM | POA: Insufficient documentation

## 2013-05-31 DIAGNOSIS — Z791 Long term (current) use of non-steroidal anti-inflammatories (NSAID): Secondary | ICD-10-CM | POA: Insufficient documentation

## 2013-05-31 MED ORDER — BUPIVACAINE-EPINEPHRINE PF 0.5-1:200000 % IJ SOLN
1.8000 mL | Freq: Once | INTRAMUSCULAR | Status: AC
  Administered 2013-06-01: 9 mg
  Filled 2013-05-31: qty 1.8

## 2013-05-31 MED ORDER — BUPIVACAINE-EPINEPHRINE (PF) 0.5% -1:200000 IJ SOLN
INTRAMUSCULAR | Status: AC
Start: 2013-05-31 — End: 2013-06-01
  Administered 2013-06-01: 9 mg
  Filled 2013-05-31: qty 1.8

## 2013-05-31 NOTE — ED Notes (Signed)
Facial swelling and dental pain x 2 days

## 2013-05-31 NOTE — ED Provider Notes (Signed)
CSN: [id number]     Arrival date & time 05/31/13  2333 History  This chart was scribed for Brandon Bartlett Brandon Wissink, MD by Beverly MilchJ Harrison Collins, ED Scribe. This patient was seen in room MH06/MH06 and the patient's care was started at 11:47 PM.    Chief Complaint  Patient presents with  . Dental Pain     The history is provided by the patient. No language interpreter was used.  HPI Comments: Brandon Bartlett is a 38 y.o. male who presents to the Emergency Department complaining of dental pain associated with left upper second incisor with associated swelling that began hurting today. He states that the swelling began last night. He reports taking ibuprofen with no relief today. Pt states talking, eating, and drinking all worsen his pain. He reports he has no insurance and would prefer free dental work.   Past Medical History  Diagnosis Date  . Arthritis   . Restless leg syndrome   . Sciatic pain     Past Surgical History  Procedure Laterality Date  . Testicle surgery      Family History  Problem Relation Age of Onset  . Cancer Mother     History  Substance Use Topics  . Smoking status: Current Every Day Smoker    Types: Cigarettes  . Smokeless tobacco: Not on file  . Alcohol Use: 0.6 oz/week    1 Cans of beer per week    Review of Systems A complete 10 system review of systems was obtained and all systems are negative except as noted in the HPI and PMH.    Allergies  Codeine and Penicillins  Home Medications   Prior to Admission medications   Medication Sig Start Date End Date Taking? Authorizing Provider  ibuprofen (ADVIL,MOTRIN) 200 MG tablet Take 400 mg by mouth every 6 (six) hours as needed.   Yes Historical Provider, MD  clindamycin (CLEOCIN) 150 MG capsule Take 3 capsules (450 mg total) by mouth 3 (three) times daily. 04/21/13   Marissa Sciacca, PA-C  meloxicam (MOBIC) 7.5 MG tablet Take 1 tablet (7.5 mg total) by mouth daily. 04/21/13   Marissa Sciacca, PA-C   Multiple Vitamins-Minerals (ONE-A-DAY MENS HEALTH FORMULA) TABS Take 1 tablet by mouth daily.      Historical Provider, MD   Triage Vitals: BP 156/96  Pulse 103  Temp(Src) 99 F (37.2 C) (Oral)  Resp 22  Ht 5\' 7"  (1.702 m)  Wt 190 lb (86.183 kg)  BMI 29.75 kg/m2  SpO2 100%  Physical Exam  Nursing note and vitals reviewed. General: Well-developed, well-nourished male in no acute distress; appearance consistent with age of record HENT: normocephalic; atraumatic, Dental: widespread dental decay, tenderness and adjacent soft tissue swelling in the left upper second incisor Eyes: pupils equal, round and reactive to light; extraocular muscles intact Neck: supple, mild left anterior cervical lymphadenopathy Heart: regular rate and rhythm; no murmurs, rubs or gallops Lungs: clear to auscultation bilaterally Abdomen: soft; nondistended; nontender; no masses or hepatosplenomegaly; bowel sounds present Extremities: No deformity; full range of motion; pulses normal Neurologic: Awake, alert and oriented; motor function intact in all extremities and symmetric; no facial droop Skin: Warm and dry Psychiatric: Normal mood and affect   ED Course  Procedures (including critical care time)  DIAGNOSTIC STUDIES: Oxygen Saturation is 100% on RA, normal by my interpretation.    COORDINATION OF CARE: 11:52 PM- Pt advised of plan for treatment and pt agrees.   DENTAL BLOCK 1.8 mL of 0.5% bupivacaine with  epinephrine were injected into the buccal fold adjacent to the left upper second incisor. The patient tolerated this well and there were no immediate complications. Adequate analgesia was obtained.   MDM  I personally performed the services described in this documentation, which was scribed in my presence. The recorded information has been reviewed and is accurate.   Brandon Bartlett Romain Erion, MD 06/01/13 [phone]

## 2013-06-01 MED ORDER — CLINDAMYCIN HCL 150 MG PO CAPS: 150.0000 mg | ORAL_CAPSULE | Freq: Three times a day (TID) | ORAL | Status: DC

## 2013-06-01 MED ORDER — HYDROCODONE-ACETAMINOPHEN 5-325 MG PO TABS: 1.0000 | ORAL_TABLET | Freq: Four times a day (QID) | ORAL | Status: DC | PRN

## 2013-06-01 MED ORDER — CLINDAMYCIN HCL 150 MG PO CAPS
300.0000 mg | ORAL_CAPSULE | Freq: Once | ORAL | Status: AC
  Administered 2013-06-01: 300 mg via ORAL
  Filled 2013-06-01: qty 2

## 2013-06-01 NOTE — Discharge Instructions (Signed)

## 2013-08-10 ENCOUNTER — Encounter (HOSPITAL_BASED_OUTPATIENT_CLINIC_OR_DEPARTMENT_OTHER): Payer: Self-pay | Admitting: Emergency Medicine

## 2013-08-10 ENCOUNTER — Emergency Department (HOSPITAL_BASED_OUTPATIENT_CLINIC_OR_DEPARTMENT_OTHER)
Admission: EM | Admit: 2013-08-10 | Discharge: 2013-08-10 | Disposition: A | Discharge status: Home / Self Care | Payer: Self-pay | Attending: Emergency Medicine | Admitting: Emergency Medicine

## 2013-08-10 ENCOUNTER — Emergency Department (HOSPITAL_BASED_OUTPATIENT_CLINIC_OR_DEPARTMENT_OTHER): Payer: Self-pay

## 2013-08-10 DIAGNOSIS — Z880 Allergy status to penicillin: Secondary | ICD-10-CM | POA: Insufficient documentation

## 2013-08-10 DIAGNOSIS — Y9389 Activity, other specified: Secondary | ICD-10-CM | POA: Insufficient documentation

## 2013-08-10 DIAGNOSIS — M129 Arthropathy, unspecified: Secondary | ICD-10-CM | POA: Insufficient documentation

## 2013-08-10 DIAGNOSIS — F17200 Nicotine dependence, unspecified, uncomplicated: Secondary | ICD-10-CM | POA: Insufficient documentation

## 2013-08-10 DIAGNOSIS — Z792 Long term (current) use of antibiotics: Secondary | ICD-10-CM | POA: Insufficient documentation

## 2013-08-10 DIAGNOSIS — M25469 Effusion, unspecified knee: Secondary | ICD-10-CM | POA: Insufficient documentation

## 2013-08-10 DIAGNOSIS — Z8669 Personal history of other diseases of the nervous system and sense organs: Secondary | ICD-10-CM | POA: Insufficient documentation

## 2013-08-10 DIAGNOSIS — Y9289 Other specified places as the place of occurrence of the external cause: Secondary | ICD-10-CM | POA: Insufficient documentation

## 2013-08-10 DIAGNOSIS — Z791 Long term (current) use of non-steroidal anti-inflammatories (NSAID): Secondary | ICD-10-CM | POA: Insufficient documentation

## 2013-08-10 DIAGNOSIS — M5430 Sciatica, unspecified side: Secondary | ICD-10-CM | POA: Insufficient documentation

## 2013-08-10 DIAGNOSIS — IMO0002 Reserved for concepts with insufficient information to code with codable children: Secondary | ICD-10-CM | POA: Insufficient documentation

## 2013-08-10 DIAGNOSIS — M25461 Effusion, right knee: Secondary | ICD-10-CM

## 2013-08-10 MED ORDER — OXYCODONE-ACETAMINOPHEN 5-325 MG PO TABS: 2.0000 | ORAL_TABLET | ORAL | Status: DC | PRN

## 2013-08-10 MED ORDER — HYDROMORPHONE HCL PF 2 MG/ML IJ SOLN
2.0000 mg | Freq: Once | INTRAMUSCULAR | Status: AC
  Administered 2013-08-10: 2 mg via INTRAMUSCULAR
  Filled 2013-08-10: qty 1

## 2013-08-10 MED ORDER — IBUPROFEN 800 MG PO TABS: 800.0000 mg | ORAL_TABLET | Freq: Three times a day (TID) | ORAL | Status: DC

## 2013-08-10 MED ORDER — ONDANSETRON HCL 4 MG/2ML IJ SOLN
4.0000 mg | Freq: Once | INTRAMUSCULAR | Status: AC
  Administered 2013-08-10: 4 mg via INTRAMUSCULAR
  Filled 2013-08-10: qty 2

## 2013-08-10 NOTE — Discharge Instructions (Signed)
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
## 2013-08-11 NOTE — ED Provider Notes (Signed)
Medical screening examination/treatment/procedure(s) were performed by non-physician practitioner and as supervising physician I was immediately available for consultation/collaboration.  Megan E Docherty, MD 08/11/13 2028 

## 2013-08-18 ENCOUNTER — Other Ambulatory Visit (HOSPITAL_COMMUNITY): Payer: Self-pay | Admitting: Orthopaedic Surgery

## 2013-08-20 ENCOUNTER — Other Ambulatory Visit (HOSPITAL_COMMUNITY): Payer: Self-pay | Admitting: Orthopaedic Surgery

## 2013-08-20 DIAGNOSIS — M25561 Pain in right knee: Secondary | ICD-10-CM

## 2014-03-07 IMAGING — CR DG ANKLE COMPLETE 3+V*L*
3 series · 3 of 3 positions shown · non-contrast
Comparison: None

CLINICAL DATA: Ankle pain

LEFT ANKLE COMPLETE - 3+ VIEW

[x ankle ap left]
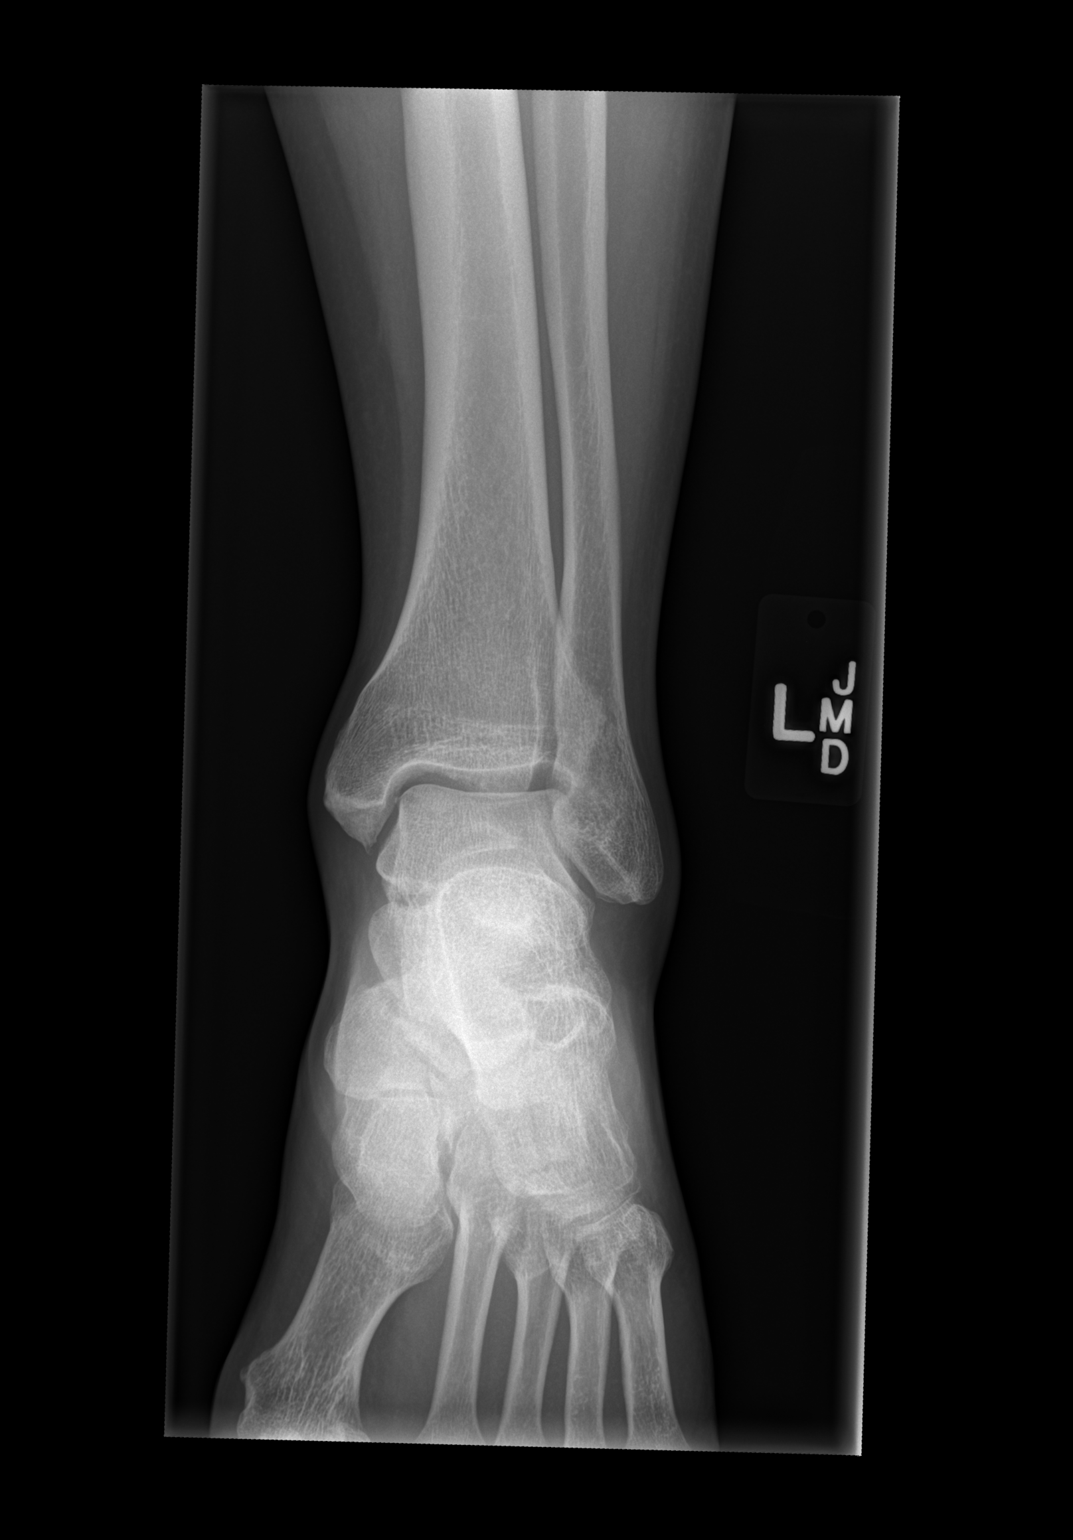

[x ankle obl left]
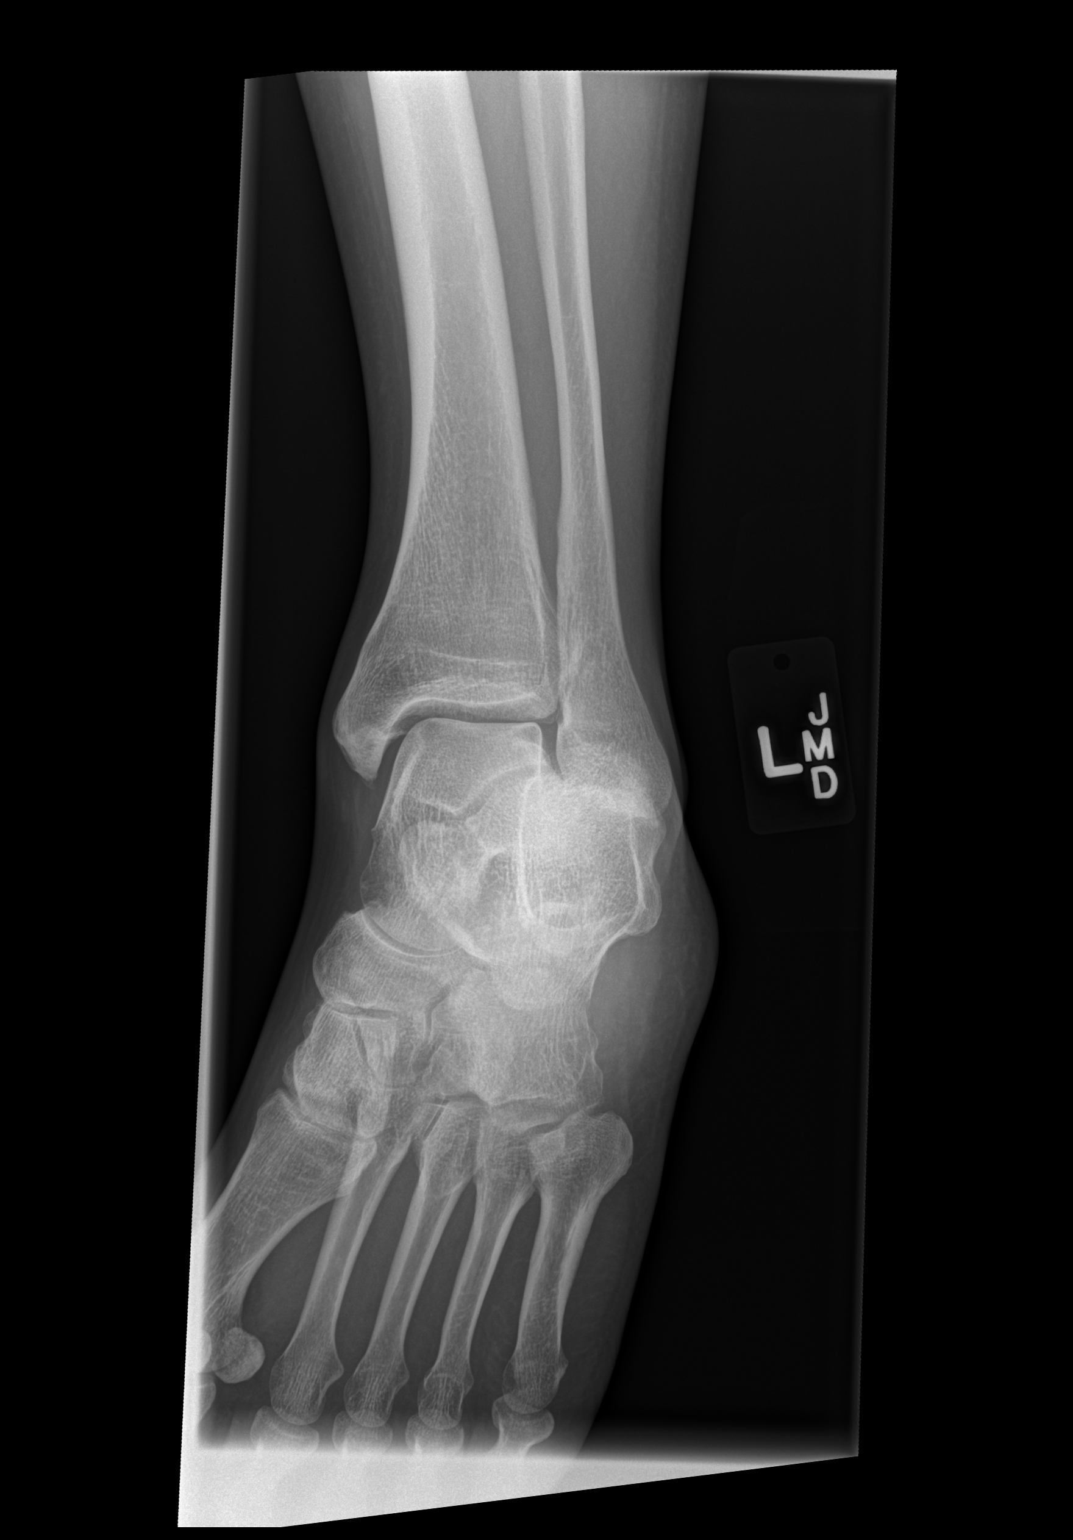

[x ankle lat left]
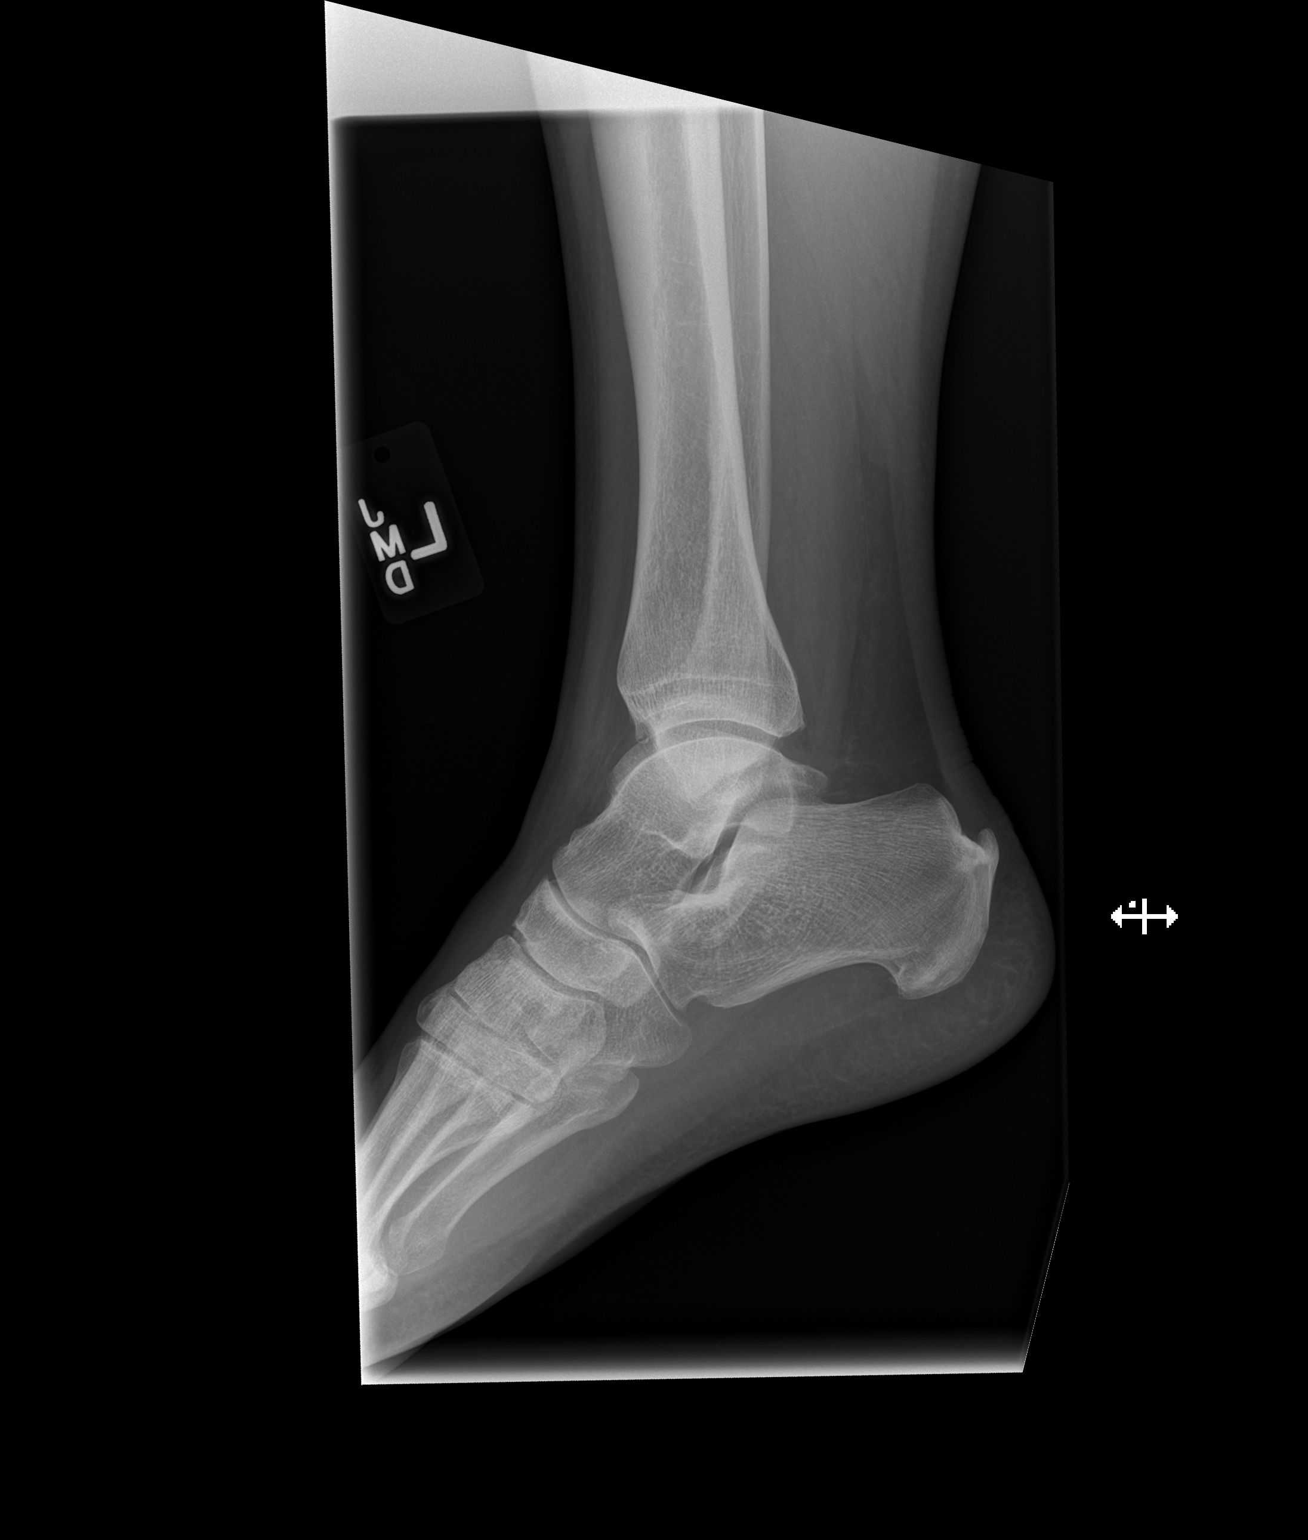

[3 of 3 positions shown; findings below may reference images not displayed]

FINDINGS: There is no evidence of fracture or dislocation.
Posterior calcaneal heel spur noted.  There is no evidence of
arthropathy or other focal bone abnormality.  Soft tissues are
unremarkable.
IMPRESSION: No acute findings.

## 2014-06-25 ENCOUNTER — Emergency Department (HOSPITAL_COMMUNITY): Payer: Self-pay

## 2014-06-25 ENCOUNTER — Encounter (HOSPITAL_COMMUNITY): Payer: Self-pay | Admitting: *Deleted

## 2014-06-25 ENCOUNTER — Emergency Department (HOSPITAL_COMMUNITY)
Admission: EM | Admit: 2014-06-25 | Discharge: 2014-06-25 | Disposition: A | Discharge status: Home / Self Care | Payer: Self-pay | Attending: Emergency Medicine | Admitting: Emergency Medicine

## 2014-06-25 DIAGNOSIS — Y92410 Unspecified street and highway as the place of occurrence of the external cause: Secondary | ICD-10-CM | POA: Insufficient documentation

## 2014-06-25 DIAGNOSIS — S3992XA Unspecified injury of lower back, initial encounter: Secondary | ICD-10-CM | POA: Insufficient documentation

## 2014-06-25 DIAGNOSIS — M5430 Sciatica, unspecified side: Secondary | ICD-10-CM | POA: Insufficient documentation

## 2014-06-25 DIAGNOSIS — Z880 Allergy status to penicillin: Secondary | ICD-10-CM | POA: Insufficient documentation

## 2014-06-25 DIAGNOSIS — Y998 Other external cause status: Secondary | ICD-10-CM | POA: Insufficient documentation

## 2014-06-25 DIAGNOSIS — S22009A Unspecified fracture of unspecified thoracic vertebra, initial encounter for closed fracture: Secondary | ICD-10-CM

## 2014-06-25 DIAGNOSIS — S22080A Wedge compression fracture of T11-T12 vertebra, initial encounter for closed fracture: Secondary | ICD-10-CM | POA: Insufficient documentation

## 2014-06-25 DIAGNOSIS — Z79899 Other long term (current) drug therapy: Secondary | ICD-10-CM | POA: Insufficient documentation

## 2014-06-25 DIAGNOSIS — Z720 Tobacco use: Secondary | ICD-10-CM | POA: Insufficient documentation

## 2014-06-25 DIAGNOSIS — Z8669 Personal history of other diseases of the nervous system and sense organs: Secondary | ICD-10-CM | POA: Insufficient documentation

## 2014-06-25 DIAGNOSIS — Y9389 Activity, other specified: Secondary | ICD-10-CM | POA: Insufficient documentation

## 2014-06-25 DIAGNOSIS — M1990 Unspecified osteoarthritis, unspecified site: Secondary | ICD-10-CM | POA: Insufficient documentation

## 2014-06-25 LAB — CBC WITH DIFFERENTIAL/PLATELET
BASOS ABS: 0 10*3/uL (ref 0.0–0.1)
Basophils Relative: 0 % (ref 0–1)
Eosinophils Absolute: 0 10*3/uL (ref 0.0–0.7)
Eosinophils Relative: 0 % (ref 0–5)
HCT: 42.6 % (ref 39.0–52.0)
HEMOGLOBIN: 14.9 g/dL (ref 13.0–17.0)
LYMPHS ABS: 2.2 10*3/uL (ref 0.7–4.0)
LYMPHS PCT: 19 % (ref 12–46)
MCH: 31.5 pg (ref 26.0–34.0)
MCHC: 35 g/dL (ref 30.0–36.0)
MCV: 90.1 fL (ref 78.0–100.0)
MONOS PCT: 4 % (ref 3–12)
Monocytes Absolute: 0.4 10*3/uL (ref 0.1–1.0)
NEUTROS ABS: 8.7 10*3/uL — AB (ref 1.7–7.7)
NEUTROS PCT: 77 % (ref 43–77)
Platelets: 235 10*3/uL (ref 150–400)
RBC: 4.73 MIL/uL (ref 4.22–5.81)
RDW: 12.6 % (ref 11.5–15.5)
WBC: 11.3 10*3/uL — ABNORMAL HIGH (ref 4.0–10.5)

## 2014-06-25 LAB — BASIC METABOLIC PANEL
ANION GAP: 9 (ref 5–15)
BUN: 9 mg/dL (ref 6–20)
CALCIUM: 9 mg/dL (ref 8.9–10.3)
CHLORIDE: 105 mmol/L (ref 101–111)
CO2: 26 mmol/L (ref 22–32)
Creatinine, Ser: 1 mg/dL (ref 0.61–1.24)
GFR calc Af Amer: >60 mL/min (ref >60)
GFR calc non Af Amer: >60 mL/min (ref >60)
Glucose, Bld: 89 mg/dL (ref 65–99)
Potassium: 3.8 mmol/L (ref 3.5–5.1)
SODIUM: 140 mmol/L (ref 135–145)

## 2014-06-25 MED ORDER — IBUPROFEN 800 MG PO TABS: 800.0000 mg | ORAL_TABLET | Freq: Three times a day (TID) | ORAL | Status: DC

## 2014-06-25 MED ORDER — SODIUM CHLORIDE 0.9 % IV SOLN
Freq: Once | INTRAVENOUS | Status: AC
  Administered 2014-06-25: 20:00:00 via INTRAVENOUS

## 2014-06-25 MED ORDER — HYDROMORPHONE HCL 1 MG/ML IJ SOLN
1.0000 mg | Freq: Once | INTRAMUSCULAR | Status: AC
  Administered 2014-06-25: 1 mg via INTRAVENOUS
  Filled 2014-06-25: qty 1

## 2014-06-25 MED ORDER — OXYCODONE-ACETAMINOPHEN 5-325 MG PO TABS
2.0000 | ORAL_TABLET | Freq: Once | ORAL | Status: AC
Start: 2014-06-25 — End: 2014-06-25
  Administered 2014-06-25: 2 via ORAL
  Filled 2014-06-25: qty 2

## 2014-06-25 MED ORDER — OXYCODONE-ACETAMINOPHEN 5-325 MG PO TABS
1.0000 | ORAL_TABLET | ORAL | Status: DC | PRN
Start: 2014-06-25 — End: 2014-06-29
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
## 2014-06-25 NOTE — Discharge Instructions (Signed)
Back, Compression Fracture °A compression fracture happens when a force is put upon the length of your spine. Slipping and falling on your bottom are examples of such a force. When this happens, sometimes the force is great enough to compress the building blocks (vertebral bodies) of your spine. Although this causes a lot of pain, this can usually be treated at home, unless your caregiver feels hospitalization is needed for pain control. °Your backbone (spinal column) is made up of 24 main vertebral bodies in addition to the sacrum and coccyx (see illustration). These are held together by tough fibrous tissues (ligaments) and by support of your muscles. Nerve roots pass through the openings between the vertebrae. A sudden wrenching move, injury, or a fall may cause a compression fracture of one of the vertebral bodies. This may result in back pain or spread of pain into the belly (abdomen), the buttocks, and down the leg into the foot. Pain may also be created by muscle spasm alone. °Large studies have been undertaken to determine the best possible course of action to help your back following injury and also to prevent future problems. The recommendations are as follows. °FOLLOWING A COMPRESSION FRACTURE: °Do the following only if advised by your caregiver.  °· If a back brace has been suggested or provided, wear it as directed. °· Do not stop wearing the back brace unless instructed by your caregiver. °· When allowed to return to regular activities, avoid a sedentary lifestyle. Actively exercise. Sporadic weekend binges of tennis, racquetball, or waterskiing may actually aggravate or create problems, especially if you are not in condition for that activity. °· Avoid sports requiring sudden body movements until you are in condition for them. Swimming and walking are safer activities. °· Maintain good posture. °· Avoid obesity. °· If not already done, you should have a DEXA scan. Based on the results, be treated for  osteoporosis. °FOLLOWING ACUTE (SUDDEN) INJURY: °· Only take over-the-counter or prescription medicines for pain, discomfort, or fever as directed by your caregiver. °· Use bed rest for only the most extreme acute episode. Prolonged bed rest may aggravate your condition. Ice used for acute conditions is effective. Use a large plastic bag filled with ice. Wrap it in a towel. This also provides excellent pain relief. This may be continuous. Or use it for 30 minutes every 2 hours during acute phase, then as needed. Heat for 30 minutes prior to activities is helpful. °· As soon as the acute phase (the time when your back is too painful for you to do normal activities) is over, it is important to resume normal activities and work hardening programs. Back injuries can cause potentially marked changes in lifestyle. So it is important to attack these problems aggressively. °· See your caregiver for continued problems. He or she can help or refer you for appropriate exercises, physical therapy, and work hardening if needed. °· If you are given narcotic medications for your condition, for the next 24 hours do not: °¨ Drive. °¨ Operate machinery or power tools. °¨ Sign legal documents. °· Do not drink alcohol, or take sleeping pills or other medications that may interfere with treatment. °If your caregiver has given you a follow-up appointment, it is very important to keep that appointment. Not keeping the appointment could result in a chronic or permanent injury, pain, and disability. If there is any problem keeping the appointment, you must call back to this facility for assistance.  °SEEK IMMEDIATE MEDICAL CARE IF: °· You develop numbness,   tingling, weakness, or problems with the use of your arms or legs. °· You develop severe back pain not relieved with medications. °· You have changes in bowel or bladder control. °· You have increasing pain in any areas of the body. °Document Released: 01/23/2005 Document Revised:  06/09/2013 Document Reviewed: 08/28/2007 °ExitCare® Patient Information ©2015 [employer]. This information is not intended to replace advice given to you by your health care provider. Make sure you discuss any questions you have with your health care provider. ° °

## 2014-06-29 ENCOUNTER — Encounter (HOSPITAL_BASED_OUTPATIENT_CLINIC_OR_DEPARTMENT_OTHER): Payer: Self-pay | Admitting: *Deleted

## 2014-06-29 ENCOUNTER — Emergency Department (HOSPITAL_BASED_OUTPATIENT_CLINIC_OR_DEPARTMENT_OTHER)
Admission: EM | Admit: 2014-06-29 | Discharge: 2014-06-29 | Disposition: A | Discharge status: Home / Self Care | Payer: Self-pay | Attending: Emergency Medicine | Admitting: Emergency Medicine

## 2014-06-29 DIAGNOSIS — M1990 Unspecified osteoarthritis, unspecified site: Secondary | ICD-10-CM | POA: Insufficient documentation

## 2014-06-29 DIAGNOSIS — Z880 Allergy status to penicillin: Secondary | ICD-10-CM | POA: Insufficient documentation

## 2014-06-29 DIAGNOSIS — Z8669 Personal history of other diseases of the nervous system and sense organs: Secondary | ICD-10-CM | POA: Insufficient documentation

## 2014-06-29 DIAGNOSIS — S22000D Wedge compression fracture of unspecified thoracic vertebra, subsequent encounter for fracture with routine healing: Secondary | ICD-10-CM | POA: Insufficient documentation

## 2014-06-29 DIAGNOSIS — M5430 Sciatica, unspecified side: Secondary | ICD-10-CM | POA: Insufficient documentation

## 2014-06-29 DIAGNOSIS — Z79899 Other long term (current) drug therapy: Secondary | ICD-10-CM | POA: Insufficient documentation

## 2014-06-29 DIAGNOSIS — Z791 Long term (current) use of non-steroidal anti-inflammatories (NSAID): Secondary | ICD-10-CM | POA: Insufficient documentation

## 2014-06-29 DIAGNOSIS — Z720 Tobacco use: Secondary | ICD-10-CM | POA: Insufficient documentation

## 2014-06-29 DIAGNOSIS — M545 Low back pain: Secondary | ICD-10-CM | POA: Insufficient documentation

## 2014-06-29 MED ORDER — OXYCODONE-ACETAMINOPHEN 5-325 MG PO TABS: 1.0000 | ORAL_TABLET | Freq: Four times a day (QID) | ORAL | Status: DC | PRN

## 2014-06-29 NOTE — Discharge Instructions (Signed)
Thoracolumbosacral Orthosis Brace A thoracolumbosacral orthosis (TLSO) brace can be worn for different purposes. A TLSO brace can be worn to support the back. Your back may need more support if you had a broken bone in your back (fractured vertebrae), back surgery, or you cannot move (paralysis) your torso muscles. A TLSO brace can also be worn by a child to help prevent curving back problems from getting worse as the child grows. TLSO braces are used to limit bending and twisting. The braces are made from a hard plastic. They are custom fit for each wearer. The TLSO brace covers both the front and back of the torso. On the back, the brace reaches from just above the tailbone to just below the shoulder blades. On the front, the brace covers the ribs and often reaches past the waist and over the hips. The brace can be worn under clothing.  HOME CARE INSTRUCTIONS  Ask your caregiver if you can remove your brace when showering or sleeping.  Follow your caregiver's instructions for putting on your brace.  Follow your caregiver's instructions about how much weight you can lift.  Always wear a T-shirt under the brace.  Make sure the brace is always well-aligned and fits you closely.  Check your skin daily for sores and redness caused by rubbing or pressure.  If you feel unsteady, use a cane or walker for support until you feel more steady.  Sit in high, firm chairs. It may be difficult to stand up from low, soft chairs.  Keep all follow-up appointments as directed by your caregiver. SEEK IMMEDIATE MEDICAL CARE IF:  You have a fever.  You have shortness of breath.  You have chest pain.  You have numbness, tingling, or pain. Developed in conjunction with the [employer] [facility] [employer] [facility]. Document Released: 01/12/2011 Document Revised: 04/17/2011 Document Reviewed: 01/12/2011 [facility] Patient Information 2015 [place], [place]. This information is not  intended to replace advice given to you by your health care provider. Make sure you discuss any questions you have with your health care provider.

## 2014-06-29 NOTE — ED Notes (Signed)
Pt's  family calling [facility] sx now as I do d/c instructions to get the back brace that is recommended, It is not available at this facility.  Pt reports frustration over costs and 'nothing' being done
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
## 2014-06-29 NOTE — ED Notes (Signed)
Pt ready for d/c, family at bs with questions concerning pain control issues.

## 2014-06-29 NOTE — ED Notes (Signed)
Patient's visitor questions discharge prescription and it's strength, RN made aware.
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
## 2014-07-01 ENCOUNTER — Ambulatory Visit: Payer: Self-pay

## 2014-07-02 ENCOUNTER — Ambulatory Visit: Payer: Self-pay | Attending: Family Medicine | Admitting: Family Medicine

## 2014-07-02 VITALS — BP 134/88 | HR 81 | Wt 176.6 lb

## 2014-07-02 DIAGNOSIS — Z79891 Long term (current) use of opiate analgesic: Secondary | ICD-10-CM | POA: Insufficient documentation

## 2014-07-02 DIAGNOSIS — S22088A Other fracture of T11-T12 vertebra, initial encounter for closed fracture: Secondary | ICD-10-CM | POA: Insufficient documentation

## 2014-07-02 DIAGNOSIS — S22080A Wedge compression fracture of T11-T12 vertebra, initial encounter for closed fracture: Secondary | ICD-10-CM | POA: Insufficient documentation

## 2014-07-02 DIAGNOSIS — M4854XA Collapsed vertebra, not elsewhere classified, thoracic region, initial encounter for fracture: Secondary | ICD-10-CM

## 2014-07-02 DIAGNOSIS — M549 Dorsalgia, unspecified: Secondary | ICD-10-CM

## 2014-07-02 DIAGNOSIS — M5440 Lumbago with sciatica, unspecified side: Secondary | ICD-10-CM | POA: Insufficient documentation

## 2014-07-02 DIAGNOSIS — F17210 Nicotine dependence, cigarettes, uncomplicated: Secondary | ICD-10-CM | POA: Insufficient documentation
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
## 2014-07-02 NOTE — Patient Instructions (Signed)
Continue as instructed in ED Make an appointment with finance for financial aid. Follow-up with assigned provider here in 4 weeks.

## 2018-09-09 ENCOUNTER — Emergency Department (HOSPITAL_BASED_OUTPATIENT_CLINIC_OR_DEPARTMENT_OTHER)
Admission: EM | Admit: 2018-09-09 | Discharge: 2018-09-10 | Disposition: A | Discharge status: Home / Self Care | Payer: Self-pay | Attending: Emergency Medicine | Admitting: Emergency Medicine

## 2018-09-09 ENCOUNTER — Encounter (HOSPITAL_BASED_OUTPATIENT_CLINIC_OR_DEPARTMENT_OTHER): Payer: Self-pay | Admitting: *Deleted

## 2018-09-09 ENCOUNTER — Other Ambulatory Visit: Payer: Self-pay

## 2018-09-09 ENCOUNTER — Emergency Department (HOSPITAL_BASED_OUTPATIENT_CLINIC_OR_DEPARTMENT_OTHER): Payer: Self-pay

## 2018-09-09 DIAGNOSIS — N200 Calculus of kidney: Secondary | ICD-10-CM | POA: Insufficient documentation

## 2018-09-09 DIAGNOSIS — F17210 Nicotine dependence, cigarettes, uncomplicated: Secondary | ICD-10-CM | POA: Insufficient documentation

## 2018-09-09 MED ORDER — FENTANYL CITRATE (PF) 100 MCG/2ML IJ SOLN
50.0000 ug | INTRAMUSCULAR | Status: DC | PRN
  Administered 2018-09-09: 50 ug via INTRAVENOUS
  Filled 2018-09-09: qty 2

## 2018-09-09 MED ORDER — ONDANSETRON HCL 4 MG/2ML IJ SOLN
4.0000 mg | Freq: Once | INTRAMUSCULAR | Status: AC
  Administered 2018-09-09: 4 mg via INTRAVENOUS
  Filled 2018-09-09: qty 2

## 2018-09-09 NOTE — ED Triage Notes (Signed)
Pt c/o right flank  Pain x 2 days " off and on "

## 2018-09-10 LAB — URINALYSIS, ROUTINE W REFLEX MICROSCOPIC
Bilirubin Urine: NEGATIVE
Glucose, UA: NEGATIVE mg/dL
Ketones, ur: 15 mg/dL — AB
Leukocytes,Ua: NEGATIVE
Nitrite: NEGATIVE
Protein, ur: 30 mg/dL — AB
Specific Gravity, Urine: >1.03 — ABNORMAL HIGH (ref 1.005–1.030)
pH: 6 (ref 5.0–8.0)

## 2018-09-10 LAB — CBC WITH DIFFERENTIAL/PLATELET
Abs Immature Granulocytes: 0.16 10*3/uL — ABNORMAL HIGH (ref 0.00–0.07)
Basophils Absolute: 0.2 10*3/uL — ABNORMAL HIGH (ref 0.0–0.1)
Basophils Relative: 1 %
Eosinophils Absolute: 0.3 10*3/uL (ref 0.0–0.5)
Eosinophils Relative: 2 %
HCT: 49.2 % (ref 39.0–52.0)
Hemoglobin: 16.6 g/dL (ref 13.0–17.0)
Immature Granulocytes: 1 %
Lymphocytes Relative: 22 %
Lymphs Abs: 3.9 10*3/uL (ref 0.7–4.0)
MCH: 30.8 pg (ref 26.0–34.0)
MCHC: 33.7 g/dL (ref 30.0–36.0)
MCV: 91.3 fL (ref 80.0–100.0)
Monocytes Absolute: 1.1 10*3/uL — ABNORMAL HIGH (ref 0.1–1.0)
Monocytes Relative: 6 %
Neutro Abs: 12.2 10*3/uL — ABNORMAL HIGH (ref 1.7–7.7)
Neutrophils Relative %: 68 %
Platelets: 311 10*3/uL (ref 150–400)
RBC: 5.39 MIL/uL (ref 4.22–5.81)
RDW: 12.5 % (ref 11.5–15.5)
WBC: 17.7 10*3/uL — ABNORMAL HIGH (ref 4.0–10.5)
nRBC: 0 % (ref 0.0–0.2)

## 2018-09-10 LAB — COMPREHENSIVE METABOLIC PANEL
ALT: 28 U/L (ref 0–44)
AST: 25 U/L (ref 15–41)
Albumin: 4.2 g/dL (ref 3.5–5.0)
Alkaline Phosphatase: 81 U/L (ref 38–126)
Anion gap: 12 (ref 5–15)
BUN: 11 mg/dL (ref 6–20)
CO2: 22 mmol/L (ref 22–32)
Calcium: 9 mg/dL (ref 8.9–10.3)
Chloride: 103 mmol/L (ref 98–111)
Creatinine, Ser: 1.05 mg/dL (ref 0.61–1.24)
GFR calc Af Amer: >60 mL/min (ref >60)
GFR calc non Af Amer: >60 mL/min (ref >60)
Glucose, Bld: 125 mg/dL — ABNORMAL HIGH (ref 70–99)
Potassium: 3.7 mmol/L (ref 3.5–5.1)
Sodium: 137 mmol/L (ref 135–145)
Total Bilirubin: 0.5 mg/dL (ref 0.3–1.2)
Total Protein: 7.2 g/dL (ref 6.5–8.1)

## 2018-09-10 LAB — URINALYSIS, MICROSCOPIC (REFLEX): RBC / HPF: >50 RBC/hpf (ref 0–5)

## 2018-09-10 MED ORDER — HYDROMORPHONE HCL 1 MG/ML IJ SOLN
1.0000 mg | Freq: Once | INTRAMUSCULAR | Status: AC
  Administered 2018-09-10: 1 mg via INTRAVENOUS
  Filled 2018-09-10: qty 1

## 2018-09-10 MED ORDER — ONDANSETRON HCL 4 MG/2ML IJ SOLN
4.0000 mg | Freq: Once | INTRAMUSCULAR | Status: AC
  Administered 2018-09-10: 4 mg via INTRAVENOUS
  Filled 2018-09-10: qty 2

## 2018-09-10 MED ORDER — ONDANSETRON 4 MG PO TBDP: 4.0000 mg | ORAL_TABLET | Freq: Four times a day (QID) | ORAL | 0 refills | Status: DC | PRN

## 2018-09-10 MED ORDER — IBUPROFEN 800 MG PO TABS: 800.0000 mg | ORAL_TABLET | Freq: Three times a day (TID) | ORAL | 0 refills | Status: DC | PRN

## 2018-09-10 MED ORDER — KETOROLAC TROMETHAMINE 30 MG/ML IJ SOLN
30.0000 mg | Freq: Once | INTRAMUSCULAR | Status: AC
  Administered 2018-09-10: 30 mg via INTRAVENOUS
  Filled 2018-09-10: qty 1

## 2018-09-10 MED ORDER — TAMSULOSIN HCL 0.4 MG PO CAPS: 0.4000 mg | ORAL_CAPSULE | Freq: Every day | ORAL | 0 refills | Status: DC

## 2018-09-10 MED ORDER — OXYCODONE-ACETAMINOPHEN 5-325 MG PO TABS: 2.0000 | ORAL_TABLET | Freq: Four times a day (QID) | ORAL | 0 refills | Status: DC | PRN

## 2018-09-10 NOTE — ED Notes (Signed)
Pt can get RX for pain medication until tomorrow sometime. Pain rated 4/10. No nausea.

## 2018-09-10 NOTE — ED Notes (Signed)
Po drink given per MD. Pt states pain much better. No emesis

## 2018-09-10 NOTE — ED Provider Notes (Signed)
TIME SEEN: 12:06 AM  CHIEF COMPLAINT: Right flank pain  HPI: Patient is a 43 year old male with history of 1 previous kidney stone who presents emergency department with 2 to 3 days of intermittent sharp right flank pain that worsened tonight.  He has had nausea and vomiting.  No fevers, chest pain, shortness of breath, cough, diarrhea.  Passed the previous stone without intervention.  Never followed up with urology.  No history of abdominal surgery.  No dysuria, hematuria, testicular pain or swelling, penile discharge.  Pain radiates into the right lower quadrant.  ROS: See HPI Constitutional: no fever  Eyes: no drainage  ENT: no runny nose   Cardiovascular:  no chest pain  Resp: no SOB  GI:  vomiting GU: no dysuria Integumentary: no rash  Allergy: no hives  Musculoskeletal: no leg swelling  Neurological: no slurred speech ROS otherwise negative  PAST MEDICAL HISTORY/PAST SURGICAL HISTORY:  Past Medical History:  Diagnosis Date  . Arthritis   . Restless leg syndrome   . Sciatic pain     MEDICATIONS:  Prior to Admission medications   Medication Sig Start Date End Date Taking? Authorizing Provider  ibuprofen (ADVIL,MOTRIN) 800 MG tablet Take 1 tablet (800 mg total) by mouth 3 (three) times daily. 06/25/14   Noemi Chapel, MD  Multiple Vitamins-Minerals (ONE-A-DAY MENS HEALTH FORMULA) TABS Take 1 tablet by mouth daily.      [provider]    ALLERGIES:  Allergies  Allergen Reactions  . Codeine Hives  . Penicillins Other (See Comments)    Unknown....was young when he was told that he was allergic    SOCIAL HISTORY:  Social History   Tobacco Use  . Smoking status: Current Every Day Smoker    Packs/day: 1.00    Types: Cigarettes  Substance Use Topics  . Alcohol use: Yes    Alcohol/week: 1.0 standard drinks    Types: 1 Cans of beer per week    FAMILY HISTORY: Family History  Problem Relation Age of Onset  . Cancer Mother     EXAM: BP (!) 163/102    Pulse 95   Temp 98 F (36.7 C)   Resp 16   Ht 5\' 5"  (1.651 m)   Wt 104.3 kg   SpO2 100%   BMI 38.27 kg/m  CONSTITUTIONAL: Alert and oriented and responds appropriately to questions.  Appears very uncomfortable HEAD: Normocephalic EYES: Conjunctivae clear, pupils appear equal, EOMI ENT: normal nose; moist mucous membranes NECK: Supple, no meningismus, no nuchal rigidity, no LAD  CARD: RRR; S1 and S2 appreciated; no murmurs, no clicks, no rubs, no gallops RESP: Normal chest excursion without splinting or tachypnea; breath sounds clear and equal bilaterally; no wheezes, no rhonchi, no rales, no hypoxia or respiratory distress, speaking full sentences ABD/GI: Normal bowel sounds; non-distended; soft, mildly tender in the right lower quadrant, no rebound, no guarding, no peritoneal signs, no hepatosplenomegaly BACK:  The back appears normal and is non-tender to palpation, there is no CVA tenderness EXT: Normal ROM in all joints; non-tender to palpation; no edema; normal capillary refill; no cyanosis, no calf tenderness or swelling    SKIN: Normal color for age and race; warm; no rash NEURO: Moves all extremities equally PSYCH: The patient's mood and manner are appropriate. Grooming and personal hygiene are appropriate.  MEDICAL DECISION MAKING: Patient here with sudden onset colicky type flank pain radiating to the abdomen.  Urine obtained in triage shows blood but no sign of infection.  Labs show leukocytosis which  may be reactive.  No fever.  CT scan pending.  He has received fentanyl and Zofran ordered by triage staff without much relief.  Will give dose of Toradol, Zofran.  Suspect kidney stone.  Less likely dissection, appendicitis, colitis, bowel obstruction, perforation, musculoskeletal pain.  ED PROGRESS: CT scan shows 3 mm mid ureteral stone with mild right-sided hydronephrosis.  Appendix is normal.  Otherwise no acute abnormalities noted.  Patient's pain is been well controlled after  fentanyl, Toradol and 2 doses of IV Dilaudid.  No further vomiting.  He feels comfortable with plan for discharge home.  Will discharge with prescriptions of Percocet, ibuprofen, Zofran, Flomax and give outpatient urology follow-up information.  Provided with work note.  His wife is here to drive him home.   At this time, I do not feel there is any life-threatening condition present. I have reviewed and discussed all results (EKG, imaging, lab, urine as appropriate) and exam findings with patient/family. I have reviewed nursing notes and appropriate previous records.  I feel the patient is safe to be discharged home without further emergent workup and can continue workup as an outpatient as needed. Discussed usual and customary return precautions. Patient/family verbalize understanding and are comfortable with this plan.  Outpatient follow-up has been provided as needed. All questions have been answered.      Ward, Layla MawKristen N, DO 09/10/18 [phone]

## 2018-09-10 NOTE — ED Notes (Signed)
Patient transported to CT 

## 2018-09-10 NOTE — ED Notes (Signed)
ED Provider at bedside. 

## 2018-09-11 LAB — URINE CULTURE: Culture: NO GROWTH

## 2021-03-31 IMAGING — CT CT RENAL STONE PROTOCOL
2 of 4 series · 15 of 46 positions shown, 17 images · non-contrast
Comparison: 11/11/2008

CLINICAL DATA: Right-sided flank pain for 2 days

EXAM:
CT ABDOMEN AND PELVIS WITHOUT CONTRAST
TECHNIQUE: Multidetector CT imaging of the abdomen and pelvis was performed
following the standard protocol without IV contrast.

[Series 2: axial st · axial · 0.88mm/px · z∈[-478,-68]mm · 12 of 96 slices shown, 14 images]
[im 7/96  soft-tissue]
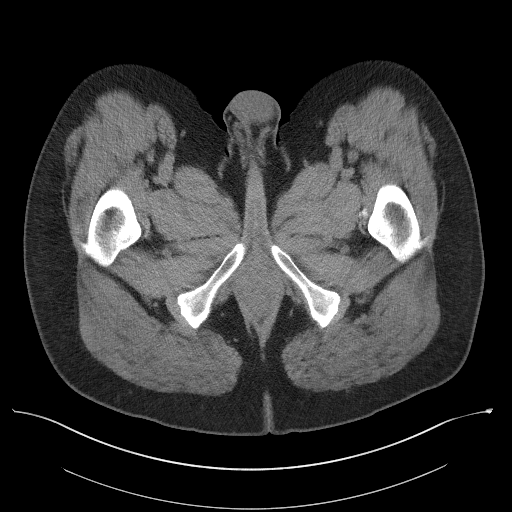
[im 7/96  bone]
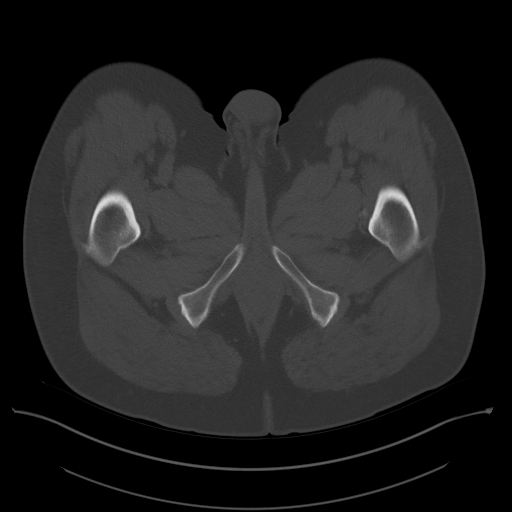
[im 13/96  soft-tissue]
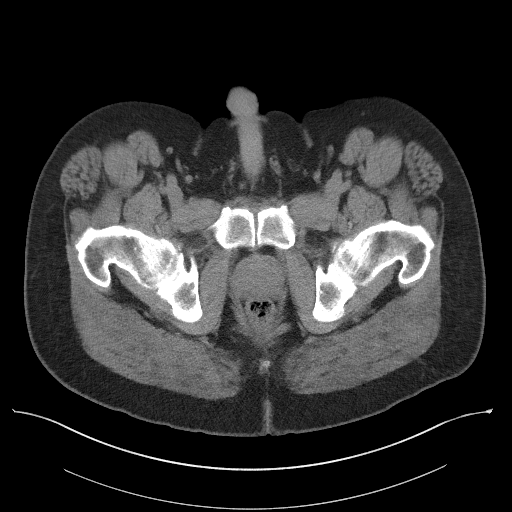
[im 22/96  soft-tissue]
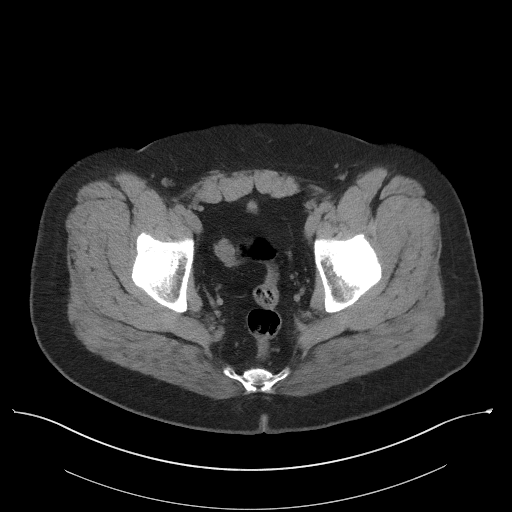
[im 28/96  soft-tissue]
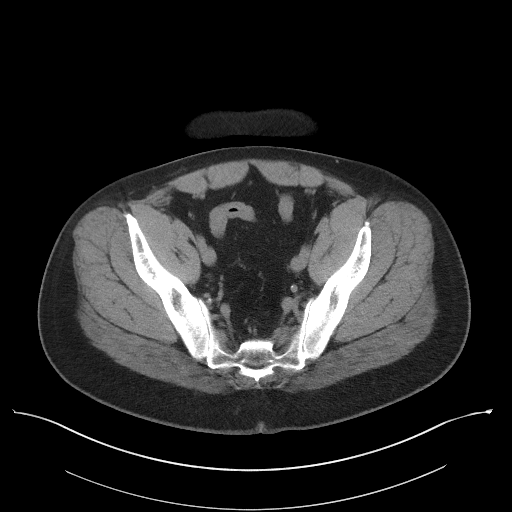
[im 37/96  soft-tissue]
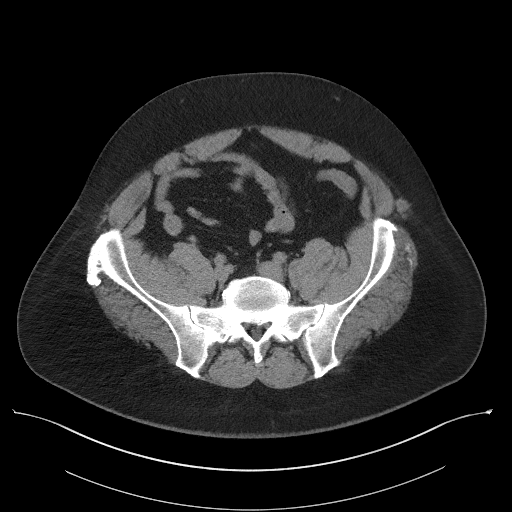
[im 43/96  soft-tissue]
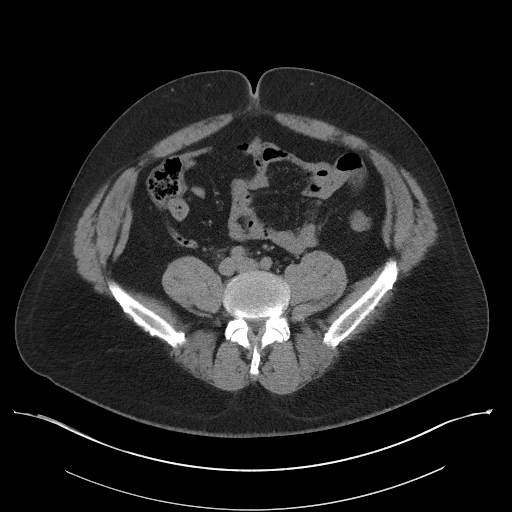
[im 53/96  soft-tissue]
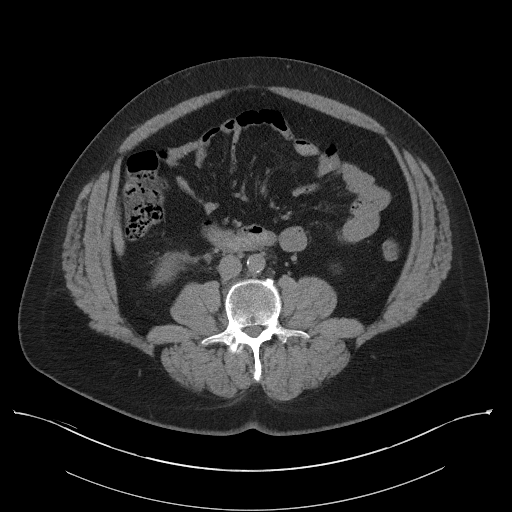
[im 59/96  soft-tissue]
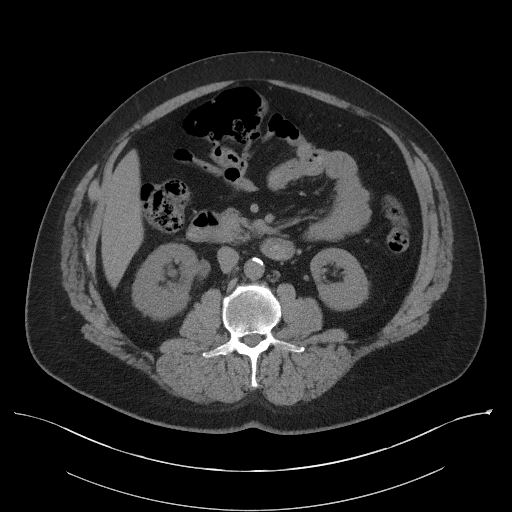
[im 68/96  soft-tissue]
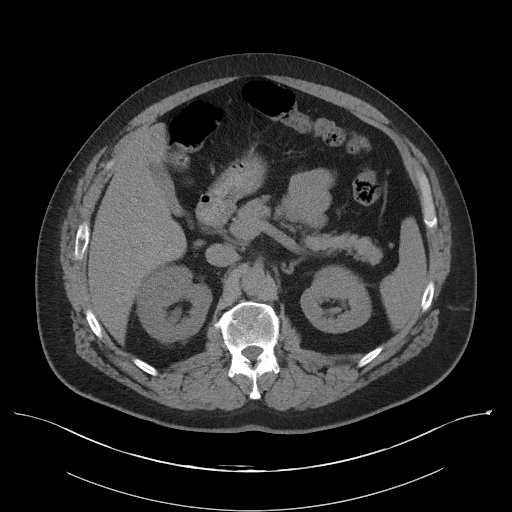
[im 68/96  bone]
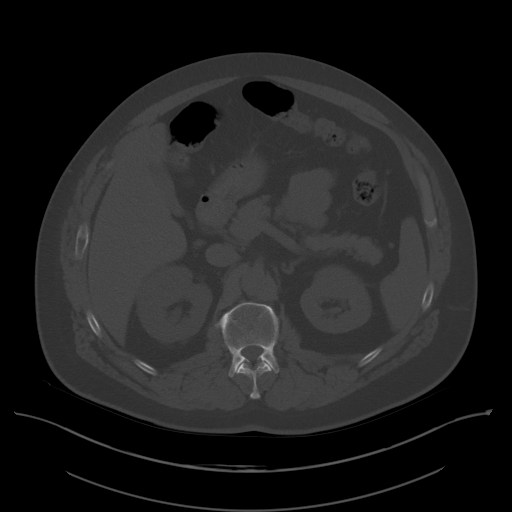
[im 74/96  soft-tissue]
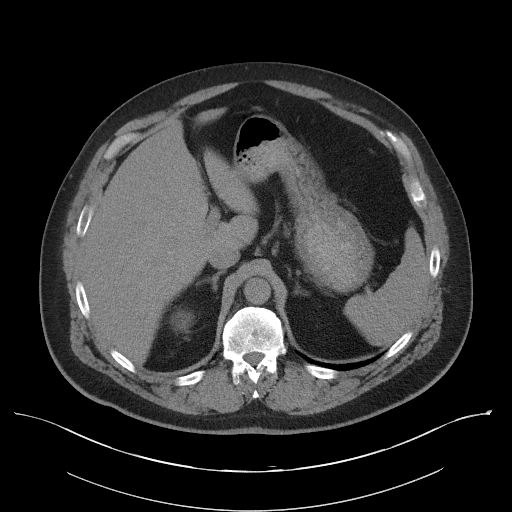
[im 83/96  soft-tissue]
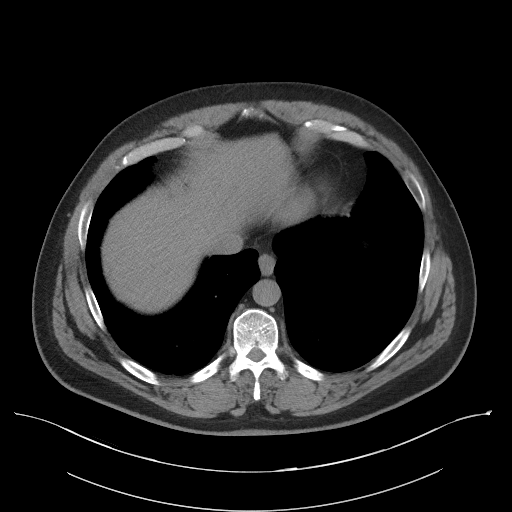
[im 89/96  soft-tissue]
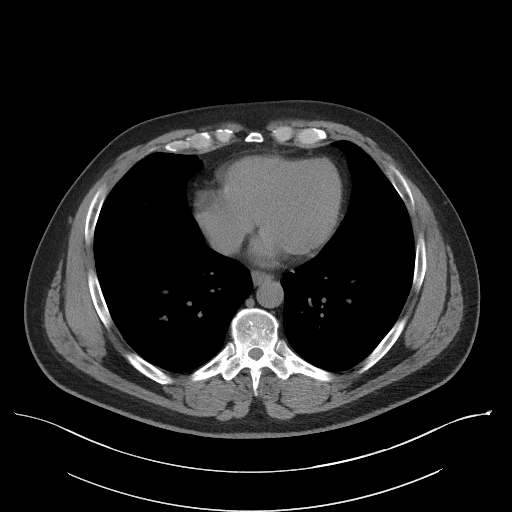

[Series 4: coronal st · coronal · 0.94mm/px · 3 of 101 slices shown]
[im 34/101  soft-tissue]
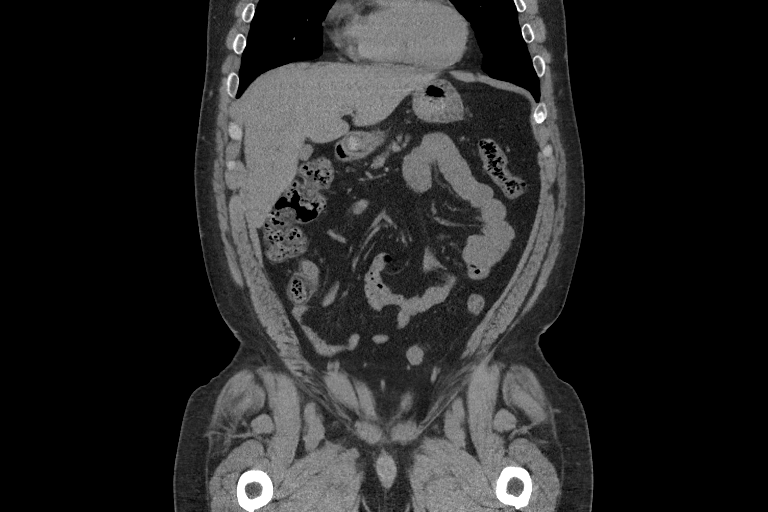
[im 45/101  soft-tissue]
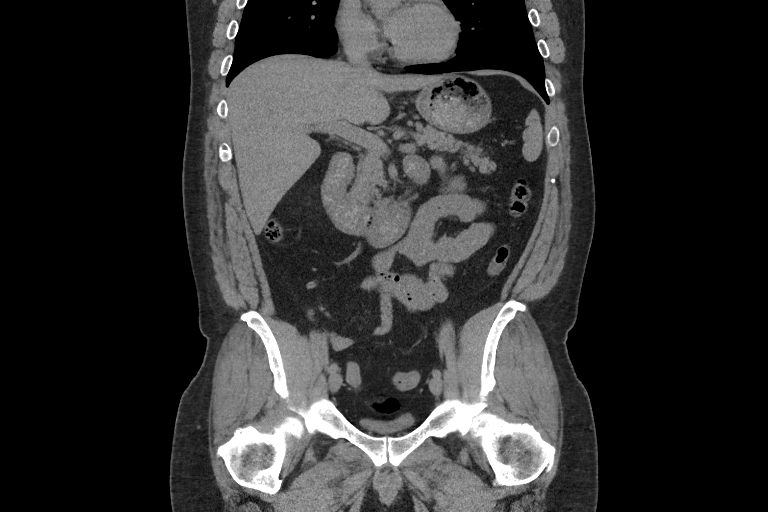
[im 56/101  soft-tissue]
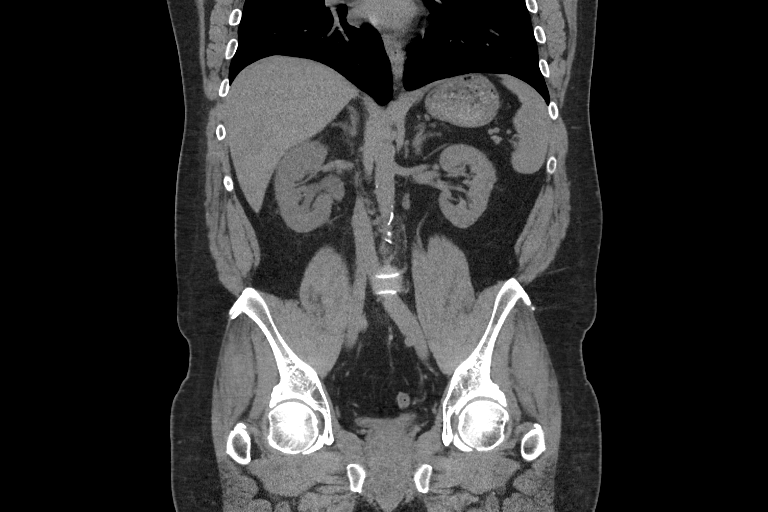

[15 of 46 positions shown; findings below may reference images not displayed]

FINDINGS: Lower chest: Few tiny nodular densities are noted in the lung bases
stable from previous exam consistent with a benign etiology.

Hepatobiliary: No focal liver abnormality is seen. No gallstones,
gallbladder wall thickening, or biliary dilatation.

Pancreas: Unremarkable. No pancreatic ductal dilatation or
surrounding inflammatory changes.

Spleen: Normal in size without focal abnormality.

Adrenals/Urinary Tract: Adrenal glands are within normal limits. The
left kidney shows no obstructive change or renal calculi. The
bladder is decompressed. The right kidney shows mild hydronephrosis
and proximal hydroureter. This is secondary to a 3 mm stone in the
mid right ureter. The more distal right ureter is unremarkable.
Nonobstructing right renal stones measuring 1-2 mm are noted as
well.

Stomach/Bowel: Stomach is within normal limits. Appendix appears
normal. No evidence of bowel wall thickening, distention, or
inflammatory changes.

Vascular/Lymphatic: Aortic atherosclerosis. No enlarged abdominal or
pelvic lymph nodes.

Reproductive: Prostate is unremarkable.

Other: No abdominal wall hernia or abnormality. No abdominopelvic
ascites.

Musculoskeletal: Chronic compression deformity of T12 is noted.
IMPRESSION: 3 mm mid ureteral stone on the right with mild hydronephrosis.

Tiny nonobstructing right renal stones.

Stable nodules in the lungs bilaterally consistent with a benign
etiology.

## 2021-10-02 ENCOUNTER — Emergency Department (HOSPITAL_BASED_OUTPATIENT_CLINIC_OR_DEPARTMENT_OTHER)
Admission: EM | Admit: 2021-10-02 | Discharge: 2021-10-03 | Disposition: A | Discharge status: Home / Self Care | Payer: Self-pay | Attending: Emergency Medicine | Admitting: Emergency Medicine

## 2021-10-02 ENCOUNTER — Encounter (HOSPITAL_BASED_OUTPATIENT_CLINIC_OR_DEPARTMENT_OTHER): Payer: Self-pay | Admitting: Urology

## 2021-10-02 ENCOUNTER — Emergency Department (HOSPITAL_BASED_OUTPATIENT_CLINIC_OR_DEPARTMENT_OTHER): Payer: Self-pay

## 2021-10-02 DIAGNOSIS — S299XXA Unspecified injury of thorax, initial encounter: Secondary | ICD-10-CM | POA: Insufficient documentation

## 2021-10-02 DIAGNOSIS — W19XXXA Unspecified fall, initial encounter: Secondary | ICD-10-CM | POA: Insufficient documentation

## 2021-10-02 DIAGNOSIS — R0781 Pleurodynia: Secondary | ICD-10-CM

## 2021-10-02 MED ORDER — METHOCARBAMOL 500 MG PO TABS
500.0000 mg | ORAL_TABLET | Freq: Once | ORAL | Status: AC
  Administered 2021-10-02: 500 mg via ORAL
  Filled 2021-10-02: qty 1

## 2021-10-02 MED ORDER — METHOCARBAMOL 500 MG PO TABS: 500.0000 mg | ORAL_TABLET | Freq: Two times a day (BID) | ORAL | 0 refills | Status: AC

## 2021-10-02 MED ORDER — HYDROCODONE-ACETAMINOPHEN 5-325 MG PO TABS: 1.0000 | ORAL_TABLET | ORAL | 0 refills | Status: DC | PRN

## 2021-10-02 MED ORDER — HYDROCODONE-ACETAMINOPHEN 5-325 MG PO TABS
1.0000 | ORAL_TABLET | Freq: Once | ORAL | Status: AC
  Administered 2021-10-02: 1 via ORAL
  Filled 2021-10-02: qty 1

## 2021-10-02 NOTE — ED Provider Notes (Signed)
[facility] EMERGENCY DEPARTMENT Provider Note   CSN: [id number] Arrival date & time: 10/02/21  2059     History  Chief Complaint  Patient presents with   Rib Injury    Brandon Bartlett is a 46 y.o. male with no significant past medical history who presents to the ED due to left-sided chest pain after an injury that occurred around 11 AM this morning.  Patient states [place] when his niece fell directly on his left ribs.  Patient states pain with deep inspiration and movement.  Denies abdominal pain, nausea, and vomiting.   History obtained from patient and past medical records. No interpreter used during encounter.       Home Medications Prior to Admission medications   Medication Sig Start Date End Date Taking? Authorizing Provider  HYDROcodone-acetaminophen (NORCO/VICODIN) 5-325 MG tablet Take 1 tablet by mouth every 4 (four) hours as needed. 10/02/21  Yes Shareta Fishbaugh, Merla Riches, PA-C  methocarbamol (ROBAXIN) 500 MG tablet Take 1 tablet (500 mg total) by mouth 2 (two) times daily. 10/02/21  Yes Atlantis Delong, Merla Riches, PA-C  ibuprofen (ADVIL) 800 MG tablet Take 1 tablet (800 mg total) by mouth every 8 (eight) hours as needed for mild pain. 09/10/18   Ward, Layla Maw, DO  Multiple Vitamins-Minerals (ONE-A-DAY MENS HEALTH FORMULA) TABS Take 1 tablet by mouth daily.      [provider]  ondansetron (ZOFRAN ODT) 4 MG disintegrating tablet Take 1 tablet (4 mg total) by mouth every 6 (six) hours as needed for nausea or vomiting. 09/10/18   Ward, Layla Maw, DO  oxyCODONE-acetaminophen (PERCOCET/ROXICET) 5-325 MG tablet Take 2 tablets by mouth every 6 (six) hours as needed for severe pain. 09/10/18   Ward, Layla Maw, DO  tamsulosin (FLOMAX) 0.4 MG CAPS capsule Take 1 capsule (0.4 mg total) by mouth daily. Take until stone passes 09/10/18   Ward, Layla Maw, DO      Allergies    Codeine and Penicillins    Review of Systems   Review of Systems   Respiratory:  Positive for shortness of breath.   Cardiovascular:  Positive for chest pain (left sided chest wall pain).  Gastrointestinal:  Negative for abdominal pain, nausea and vomiting.  Musculoskeletal:  Negative for back pain.  All other systems reviewed and are negative.   Physical Exam Updated Vital Signs BP 134/88 (BP Location: Left Arm)   Pulse 86   Temp 98.1 F (36.7 C) (Oral)   Resp (!) 22   Ht 5\' 5"  (1.651 m)   Wt 86.2 kg   SpO2 95%   BMI 31.62 kg/m  Physical Exam Vitals and nursing note reviewed.  Constitutional:      General: He is not in acute distress.    Appearance: He is not ill-appearing.  HENT:     Head: Normocephalic.  Eyes:     Pupils: Pupils are equal, round, and reactive to light.  Cardiovascular:     Rate and Rhythm: Normal rate and regular rhythm.     Pulses: Normal pulses.     Heart sounds: Normal heart sounds. No murmur heard.    No friction rub. No gallop.  Pulmonary:     Effort: Pulmonary effort is normal.     Breath sounds: Normal breath sounds.  Chest:     Comments: Tenderness throughout left side of ribs.  No crepitus or deformity. Abdominal:     General: Abdomen is flat. There is no distension.  Palpations: Abdomen is soft.     Tenderness: There is no abdominal tenderness. There is no guarding or rebound.  Musculoskeletal:        General: Normal range of motion.     Cervical back: Neck supple.  Skin:    General: Skin is warm and dry.  Neurological:     General: No focal deficit present.     Mental Status: He is alert.  Psychiatric:        Mood and Affect: Mood normal.        Behavior: Behavior normal.     ED Results / Procedures / Treatments   Labs (all labs ordered are listed, but only abnormal results are displayed) Labs Reviewed - No data to display  EKG None  Radiology DG Ribs Unilateral W/Chest Left  Result Date: 10/02/2021 CLINICAL DATA:  [place] with left-sided chest pain,  initial encounter EXAM: LEFT RIBS AND CHEST - 3+ VIEW COMPARISON:  04/08/2008 FINDINGS: Cardiac shadow is within normal limits. Lungs are well aerated bilaterally. No focal infiltrate or sizable effusion is noted. No acute rib abnormality is noted. IMPRESSION: No acute abnormality noted. Electronically Signed   By: Alcide Clever M.D.   On: 10/02/2021 21:56    Procedures Procedures    Medications Ordered in ED Medications  HYDROcodone-acetaminophen (NORCO/VICODIN) 5-325 MG per tablet 1 tablet (1 tablet Oral Given 10/02/21 2248)  methocarbamol (ROBAXIN) tablet 500 mg (500 mg Oral Given 10/02/21 2248)    ED Course/ Medical Decision Making/ A&P                           Medical Decision Making Amount and/or Complexity of Data Reviewed Radiology: ordered and independent interpretation performed. Decision-making details documented in ED Course.  Risk Prescription drug management.   46 year old male presents the ED due to left-sided rib pain after an injury that occurred earlier this [place].  Patient notes his niece fell directly on his left ribs.  Upon arrival, stable vitals.  Patient in no acute distress.  Tenderness throughout left side of ribs without crepitus or deformity.  Chest x-ray ordered at triage which I personally reviewed and interpreted which is negative for any bony fractures.  Patient given pain medication here in the ED and discharged with same. Low suspicion for PTX given unremarkable CXR. No ecchymosis.  Low suspicion for internal bleeding.  Patient discharged with pain medication and muscle relaxer.  Advised patient to follow-up with PCP symptoms not improve over the next week. Strict ED precautions discussed with patient. Patient states understanding and agrees to plan. Patient discharged home in no acute distress and stable vitals        Final Clinical Impression(s) / ED Diagnoses Final diagnoses:  Rib pain on left side    Rx / DC Orders ED Discharge  Orders          Ordered    HYDROcodone-acetaminophen (NORCO/VICODIN) 5-325 MG tablet  Every 4 hours PRN        10/02/21 2308    methocarbamol (ROBAXIN) 500 MG tablet  2 times daily        10/02/21 2308              Jesusita Oka 10/02/21 2311    Vanetta Mulders, MD 10/07/21 [phone]

## 2021-10-02 NOTE — ED Triage Notes (Signed)
Injury to left side of chest/ribs while [place], states another person landed on him Pain with deep breathing  Unable to lay on side due to pain

## 2021-10-02 NOTE — Discharge Instructions (Signed)
It was a pleasure taking care of you today.  As discussed, there were no broken ribs.  I am sending you home with pain medication and a muscle relaxer.  Take as needed for pain.  Please follow-up with PCP if symptoms do not improve over the next few days.  Return to the ER for new or worsening symptoms.

## 2021-10-12 ENCOUNTER — Other Ambulatory Visit: Payer: Self-pay

## 2021-10-12 ENCOUNTER — Emergency Department (HOSPITAL_BASED_OUTPATIENT_CLINIC_OR_DEPARTMENT_OTHER): Payer: Self-pay

## 2021-10-12 ENCOUNTER — Encounter (HOSPITAL_BASED_OUTPATIENT_CLINIC_OR_DEPARTMENT_OTHER): Payer: Self-pay | Admitting: Emergency Medicine

## 2021-10-12 DIAGNOSIS — N132 Hydronephrosis with renal and ureteral calculous obstruction: Secondary | ICD-10-CM | POA: Insufficient documentation

## 2021-10-12 LAB — CBC
HCT: 44.1 % (ref 39.0–52.0)
Hemoglobin: 15.3 g/dL (ref 13.0–17.0)
MCH: 31 pg (ref 26.0–34.0)
MCHC: 34.7 g/dL (ref 30.0–36.0)
MCV: 89.3 fL (ref 80.0–100.0)
Platelets: 339 10*3/uL (ref 150–400)
RBC: 4.94 MIL/uL (ref 4.22–5.81)
RDW: 12.4 % (ref 11.5–15.5)
WBC: 15.2 10*3/uL — ABNORMAL HIGH (ref 4.0–10.5)
nRBC: 0 % (ref 0.0–0.2)

## 2021-10-12 LAB — URINALYSIS, ROUTINE W REFLEX MICROSCOPIC
Bilirubin Urine: NEGATIVE
Glucose, UA: NEGATIVE mg/dL
Ketones, ur: NEGATIVE mg/dL
Leukocytes,Ua: NEGATIVE
Nitrite: NEGATIVE
Protein, ur: 30 mg/dL — AB
Specific Gravity, Urine: >=1.03 (ref 1.005–1.030)
pH: 5.5 (ref 5.0–8.0)

## 2021-10-12 LAB — BASIC METABOLIC PANEL
Anion gap: 9 (ref 5–15)
BUN: 14 mg/dL (ref 6–20)
CO2: 25 mmol/L (ref 22–32)
Calcium: 8.9 mg/dL (ref 8.9–10.3)
Chloride: 103 mmol/L (ref 98–111)
Creatinine, Ser: 1.3 mg/dL — ABNORMAL HIGH (ref 0.61–1.24)
GFR, Estimated: >60 mL/min (ref >60)
Glucose, Bld: 94 mg/dL (ref 70–99)
Potassium: 4.1 mmol/L (ref 3.5–5.1)
Sodium: 137 mmol/L (ref 135–145)

## 2021-10-12 LAB — URINALYSIS, MICROSCOPIC (REFLEX): RBC / HPF: >50 RBC/hpf (ref 0–5)

## 2021-10-12 MED ORDER — OXYCODONE-ACETAMINOPHEN 5-325 MG PO TABS
1.0000 | ORAL_TABLET | ORAL | Status: DC | PRN
  Administered 2021-10-12: 1 via ORAL
  Filled 2021-10-12: qty 1

## 2021-10-12 NOTE — ED Triage Notes (Signed)
Right flank pain, nausea (from the pain), burning with urination and urinary frequency x 1 week. Concerned for kidney stone.

## 2021-10-13 ENCOUNTER — Emergency Department (HOSPITAL_BASED_OUTPATIENT_CLINIC_OR_DEPARTMENT_OTHER)
Admission: EM | Admit: 2021-10-13 | Discharge: 2021-10-13 | Disposition: A | Discharge status: Home / Self Care | Payer: Self-pay | Attending: Emergency Medicine | Admitting: Emergency Medicine

## 2021-10-13 DIAGNOSIS — N200 Calculus of kidney: Secondary | ICD-10-CM

## 2021-10-13 MED ORDER — MELOXICAM 15 MG PO TABS: 15.0000 mg | ORAL_TABLET | Freq: Every day | ORAL | 0 refills | Status: AC

## 2021-10-13 MED ORDER — LACTATED RINGERS IV BOLUS
1000.0000 mL | Freq: Once | INTRAVENOUS | Status: AC
  Administered 2021-10-13: 1000 mL via INTRAVENOUS

## 2021-10-13 MED ORDER — ONDANSETRON HCL 4 MG/2ML IJ SOLN
4.0000 mg | Freq: Once | INTRAMUSCULAR | Status: AC
  Administered 2021-10-13: 4 mg via INTRAVENOUS
  Filled 2021-10-13: qty 2

## 2021-10-13 MED ORDER — TAMSULOSIN HCL 0.4 MG PO CAPS
0.4000 mg | ORAL_CAPSULE | Freq: Once | ORAL | Status: AC
  Administered 2021-10-13: 0.4 mg via ORAL
  Filled 2021-10-13: qty 1

## 2021-10-13 MED ORDER — HYDROMORPHONE HCL 1 MG/ML IJ SOLN
1.0000 mg | Freq: Once | INTRAMUSCULAR | Status: AC
  Administered 2021-10-13: 1 mg via INTRAVENOUS
  Filled 2021-10-13: qty 1

## 2021-10-13 MED ORDER — ONDANSETRON 4 MG PO TBDP: ORAL_TABLET | ORAL | 0 refills | Status: AC

## 2021-10-13 MED ORDER — KETOROLAC TROMETHAMINE 30 MG/ML IJ SOLN
30.0000 mg | Freq: Once | INTRAMUSCULAR | Status: AC
  Administered 2021-10-13: 30 mg via INTRAVENOUS
  Filled 2021-10-13: qty 1

## 2021-10-13 MED ORDER — TAMSULOSIN HCL 0.4 MG PO CAPS: 0.4000 mg | ORAL_CAPSULE | Freq: Every day | ORAL | 0 refills | Status: AC

## 2021-10-13 MED ORDER — OXYCODONE-ACETAMINOPHEN 5-325 MG PO TABS: 2.0000 | ORAL_TABLET | ORAL | 0 refills | Status: AC | PRN

## 2021-10-13 NOTE — ED Notes (Signed)
ED Provider at bedside. 

## 2021-10-13 NOTE — ED Provider Notes (Signed)
[facility] EM[facility]ENC[facility] DEPARTMENT Provider Note   CSN: [id number] Arrival date & time: 10/12/21  2045     History  Chief Complaint  Patient presents with   Flank Pain   Dysuria    Brandon Bartlett is a 46 y.o. male.  46 year old male who presents the ER today secondary to right-sided flank pain radiated to his groin.  Patient states he had dysuria for about a week but then today had severe onset of pain with associated nausea.  He feels the nausea is more related to the pain.  No hematuria.  States it feels like previous kidney stone but has not had one in a few years.  No fevers.  No other associated symptoms.   Flank Pain  Dysuria Presenting symptoms: dysuria   Associated symptoms: flank pain        Home Medications Prior to Admission medications   Medication Sig Start Date End Date Taking? Authorizing Provider  meloxicam (MOBIC) 15 MG tablet Take 1 tablet (15 mg total) by mouth daily for 10 days. 10/13/21 10/23/21 Yes Kalika Smay, Corene Cornea, MD  ondansetron (ZOFRAN-ODT) 4 MG disintegrating tablet 4mg  ODT q4 hours prn nausea/vomit 10/13/21  Yes Meliza Kage, Corene Cornea, MD  oxyCODONE-acetaminophen (PERCOCET) 5-325 MG tablet Take 2 tablets by mouth every 4 (four) hours as needed. 10/13/21  Yes Jisela Merlino, Corene Cornea, MD  tamsulosin (FLOMAX) 0.4 MG CAPS capsule Take 1 capsule (0.4 mg total) by mouth daily. Until stone passes 10/13/21  Yes Fran Mcree, Corene Cornea, MD  methocarbamol (ROBAXIN) 500 MG tablet Take 1 tablet (500 mg total) by mouth 2 (two) times daily. 10/02/21   Suzy Bouchard, PA-C  Multiple Vitamins-Minerals (ONE-A-DAY [employer] FORMULA) TABS Take 1 tablet by mouth daily.      [provider]      Allergies    Codeine and Penicillins    Review of Systems   Review of Systems  Genitourinary:  Positive for dysuria and flank pain.    Physical Exam Updated Vital Signs BP (!) 142/80   Pulse 73   Temp 98.3 F (36.8 C)   Resp 16   Ht 5\' 5"  (1.651 m)   Wt 88.5 kg   SpO2 98%    BMI 32.45 kg/m  Physical Exam Vitals and nursing note reviewed.  Constitutional:      Appearance: He is well-developed.     Comments: Appears uncomfortable 2/2 pain.   HENT:     Head: Normocephalic and atraumatic.     Mouth/Throat:     Mouth: Mucous membranes are dry.  Cardiovascular:     Rate and Rhythm: Normal rate.  Pulmonary:     Effort: Pulmonary effort is normal. No respiratory distress.  Abdominal:     General: There is no distension.  Musculoskeletal:        General: Normal range of motion.     Cervical back: Normal range of motion.  Skin:    General: Skin is warm and dry.     Findings: No rash.  Neurological:     General: No focal deficit present.     Mental Status: He is alert.     ED Results / Procedures / Treatments   Labs (all labs ordered are listed, but only abnormal results are displayed) Labs Reviewed  URINALYSIS, ROUTINE W REFLEX MICROSCOPIC - Abnormal; Notable for the following components:      Result Value   Hgb urine dipstick LARGE (*)    Protein, ur 30 (*)    All other components within  normal limits  BASIC METABOLIC PANEL - Abnormal; Notable for the following components:   Creatinine, Ser 1.30 (*)    All other components within normal limits  CBC - Abnormal; Notable for the following components:   WBC 15.2 (*)    All other components within normal limits  URINALYSIS, MICROSCOPIC (REFLEX) - Abnormal; Notable for the following components:   Bacteria, UA RARE (*)    All other components within normal limits    EKG None  Radiology CT Renal Stone Study  Result Date: 10/12/2021 CLINICAL DATA:  Flank pain, kidney stone suspected EXAM: CT ABDOMEN AND PELVIS WITHOUT CONTRAST TECHNIQUE: Multidetector CT imaging of the abdomen and pelvis was performed following the standard protocol without IV contrast. RADIATION DOSE REDUCTION: This exam was performed according to the departmental dose-optimization program which includes automated exposure  control, adjustment of the mA and/or kV according to patient size and/or use of iterative reconstruction technique. COMPARISON:  09/10/2018 FINDINGS: Lower chest: Clear lung bases. Tiny nodule in the left lower lobe stable from prior exam consistent with benign process. No further follow-up is recommended. Hepatobiliary: No focal liver abnormality is seen. No gallstones, gallbladder wall thickening, or biliary dilatation. Pancreas: No ductal dilatation or inflammation. Spleen: Normal in size without focal abnormality. Adrenals/Urinary Tract: Normal adrenal glands. There is a 5 x 7 mm obstructing stone in the distal right ureter just proximal to the ureterovesicular junction with moderate to severe hydroureteronephrosis. Minimal right perinephric edema there is a punctate nonobstructing intrarenal stone in the mid right kidney two adjacent nonobstructing stones in the mid left kidney. No left hydronephrosis. There are no left ureteral stones. Urinary bladder is completely empty. Stomach/Bowel: Stomach is within normal limits. Appendix appears normal. No evidence of bowel wall thickening, distention, or inflammatory changes. Vascular/Lymphatic: Normal caliber abdominal aorta. Moderate aortic atherosclerosis for age. No aneurysm. No adenopathy. Reproductive: Prostate is unremarkable. Other: No free air, free fluid, or intra-abdominal fluid collection. Musculoskeletal: No acute osseous findings. Prominent Schmorl's node superior endplate of L3. Mild bilateral hip degenerative change. IMPRESSION: 1. Obstructing 5 x 7 mm stone in the distal right ureter just proximal to the ureterovesicular junction with moderate to severe hydroureteronephrosis. 2. Additional nonobstructing stones in both kidneys. Aortic Atherosclerosis (ICD10-I70.0). Electronically Signed   By: Narda Rutherford M.D.   On: 10/12/2021 21:57    Procedures Procedures    Medications Ordered in ED Medications  oxyCODONE-acetaminophen (PERCOCET/ROXICET)  5-325 MG per tablet 1 tablet (1 tablet Oral Given 10/12/21 2124)  lactated ringers bolus 1,000 mL ( Intravenous Stopped 10/13/21 0315)  HYDROmorphone (DILAUDID) injection 1 mg (1 mg Intravenous Given 10/13/21 0216)  ketorolac (TORADOL) 30 MG/ML injection 30 mg (30 mg Intravenous Given 10/13/21 0215)  ondansetron (ZOFRAN) injection 4 mg (4 mg Intravenous Given 10/13/21 0214)  tamsulosin (FLOMAX) capsule 0.4 mg (0.4 mg Oral Given 10/13/21 0218)    ED Course/ Medical Decision Making/ A&P                           Medical Decision Making Amount and/or Complexity of Data Reviewed Labs: ordered. Radiology: ordered.  Risk Prescription drug management.   Kidney stone distally with some hydro and elevated creatinine. No infection. Symptoms improved with meds here. Rx provided for discharge. Urology fu PRN if not improving. Here if worsening or not controlled symptoms.   Final Clinical Impression(s) / ED Diagnoses Final diagnoses:  Kidney stone    Rx / DC Orders ED Discharge Orders  Ordered    oxyCODONE-acetaminophen (PERCOCET) 5-325 MG tablet  Every 4 hours PRN        10/13/21 0426    meloxicam (MOBIC) 15 MG tablet  Daily        10/13/21 0426    ondansetron (ZOFRAN-ODT) 4 MG disintegrating tablet        10/13/21 0426    tamsulosin (FLOMAX) 0.4 MG CAPS capsule  Daily        10/13/21 0426    Ambulatory referral to Urology        10/13/21 0426              Adenike Shidler, Barbara Cower, MD 10/13/21 [phone]
